# Patient Record
Sex: Female | Born: 1935 | Race: White | Marital: Married | Smoking: Never smoker
Health system: Northeastern US, Academic
[De-identification: ages and names within clinical notes are randomized; demographics above are authoritative.]

## PROBLEM LIST (undated history)

## (undated) DIAGNOSIS — I10 Essential (primary) hypertension: Secondary | ICD-10-CM

## (undated) HISTORY — PX: ABDOMINAL HYSTERECTOMY: SHX81

---

## 2004-01-04 ENCOUNTER — Other Ambulatory Visit: Payer: Self-pay | Admitting: Cardiology

## 2007-04-27 ENCOUNTER — Other Ambulatory Visit: Payer: Self-pay

## 2007-08-05 DIAGNOSIS — K649 Unspecified hemorrhoids: Secondary | ICD-10-CM | POA: Insufficient documentation

## 2007-08-05 DIAGNOSIS — F411 Generalized anxiety disorder: Secondary | ICD-10-CM | POA: Insufficient documentation

## 2007-08-05 DIAGNOSIS — N318 Other neuromuscular dysfunction of bladder: Secondary | ICD-10-CM | POA: Insufficient documentation

## 2007-08-05 DIAGNOSIS — K219 Gastro-esophageal reflux disease without esophagitis: Secondary | ICD-10-CM | POA: Insufficient documentation

## 2007-11-11 DIAGNOSIS — R002 Palpitations: Secondary | ICD-10-CM | POA: Insufficient documentation

## 2007-11-11 DIAGNOSIS — I10 Essential (primary) hypertension: Secondary | ICD-10-CM | POA: Insufficient documentation

## 2008-02-17 DIAGNOSIS — L719 Rosacea, unspecified: Secondary | ICD-10-CM | POA: Insufficient documentation

## 2008-02-17 DIAGNOSIS — M810 Age-related osteoporosis without current pathological fracture: Secondary | ICD-10-CM | POA: Insufficient documentation

## 2008-07-11 DIAGNOSIS — L570 Actinic keratosis: Secondary | ICD-10-CM | POA: Insufficient documentation

## 2008-07-11 DIAGNOSIS — F3289 Other specified depressive episodes: Secondary | ICD-10-CM | POA: Insufficient documentation

## 2008-08-15 DIAGNOSIS — I359 Nonrheumatic aortic valve disorder, unspecified: Secondary | ICD-10-CM | POA: Insufficient documentation

## 2008-08-15 DIAGNOSIS — E781 Pure hyperglyceridemia: Secondary | ICD-10-CM | POA: Insufficient documentation

## 2008-08-22 ENCOUNTER — Other Ambulatory Visit: Payer: Self-pay

## 2009-04-18 ENCOUNTER — Ambulatory Visit
Admit: 2009-04-18 | Discharge: 2009-04-18 | Disposition: A | Discharge status: Home / Self Care | Payer: Self-pay | Source: Ambulatory Visit | Admitting: Obstetrics

## 2009-04-26 ENCOUNTER — Ambulatory Visit: Payer: Self-pay | Admitting: Internal Medicine

## 2009-04-27 ENCOUNTER — Ambulatory Visit
Admit: 2009-04-27 | Discharge: 2009-04-27 | Disposition: A | Discharge status: Home / Self Care | Payer: Self-pay | Source: Ambulatory Visit | Attending: Internal Medicine | Admitting: Internal Medicine

## 2009-05-01 ENCOUNTER — Ambulatory Visit
Admit: 2009-05-01 | Discharge: 2009-05-01 | Disposition: A | Discharge status: Home / Self Care | Payer: Self-pay | Source: Ambulatory Visit | Attending: Internal Medicine | Admitting: Internal Medicine

## 2009-05-04 ENCOUNTER — Ambulatory Visit: Payer: Self-pay | Admitting: Internal Medicine

## 2009-05-04 ENCOUNTER — Ambulatory Visit
Admit: 2009-05-04 | Discharge: 2009-05-04 | Disposition: A | Discharge status: Home / Self Care | Payer: Self-pay | Source: Ambulatory Visit | Attending: Internal Medicine | Admitting: Internal Medicine

## 2009-05-04 LAB — CBC AND DIFFERENTIAL
Baso # K/uL: 0 THOU/uL (ref 0.0–0.1)
Basophil %: 0.4 % (ref 0.1–1.2)
Eos # K/uL: 0.2 THOU/uL (ref 0.0–0.4)
Eosinophil %: 2.6 % (ref 0.7–5.8)
Hematocrit: 37 % (ref 34–45)
Hemoglobin: 12.2 g/dL (ref 11.2–15.7)
Lymph # K/uL: 2.8 THOU/uL (ref 1.2–3.7)
Lymphocyte %: 35.3 % (ref 19.3–51.7)
MCV: 95 fL (ref 79–95)
Mono # K/uL: 0.8 THOU/uL (ref 0.2–0.9)
Monocyte %: 10.5 % (ref 4.7–12.5)
Neut # K/uL: 4.1 THOU/uL (ref 1.6–6.1)
Platelets: 288 THOU/uL (ref 160–370)
RBC: 3.9 MIL/uL (ref 3.9–5.2)
RDW: 12.7 % (ref 11.7–14.4)
Seg Neut %: 51.2 % (ref 34.0–71.1)
WBC: 8 THOU/uL (ref 4.0–10.0)

## 2009-05-04 LAB — COMPREHENSIVE METABOLIC PANEL
ALT: 15 U/L (ref 0–35)
AST: 25 U/L (ref 0–35)
Albumin: 4.1 g/dL (ref 3.5–5.2)
Alk Phos: 48 U/L (ref 35–105)
Anion Gap: 11 (ref 7–16)
Bilirubin,Total: 0.2 mg/dL (ref 0.0–1.2)
CO2: 25 mmol/L (ref 20–28)
Calcium: 8.9 mg/dL (ref 8.6–10.2)
Chloride: 97 mmol/L (ref 96–108)
Creatinine: 0.72 mg/dL (ref 0.51–0.95)
GFR,Black: >59 *
GFR,Caucasian: >59 *
Glucose: 95 mg/dL (ref 74–106)
Lab: 15 mg/dL (ref 6–20)
Potassium: 4.6 mmol/L (ref 3.3–5.1)
Sodium: 133 mmol/L (ref 133–145)
Total Protein: 7 g/dL (ref 6.3–7.7)

## 2009-05-09 LAB — HISTOPLASMA CF/ID AB PANEL
Histoplasma Ab: NOT DETECTED
Histoplasma Mycelial AB: <1:8 {titer}
Histoplasma Yeast AB: <1:8 {titer}

## 2009-05-09 LAB — COCCIDIOIDES ANTIBODIES
Coccidioides (ID) AB: NOT DETECTED
Coccidioides Ab: <1:2 {titer}
Coccidioides IgG: 0.6 IV
Coccidioides IgM: 0.1 IV

## 2009-05-24 ENCOUNTER — Ambulatory Visit: Payer: Self-pay | Admitting: Internal Medicine

## 2009-06-27 ENCOUNTER — Ambulatory Visit: Payer: Self-pay | Admitting: Internal Medicine

## 2009-06-29 ENCOUNTER — Encounter: Payer: Self-pay | Admitting: Gastroenterology

## 2009-06-29 ENCOUNTER — Ambulatory Visit: Payer: Self-pay | Admitting: Internal Medicine

## 2009-06-30 NOTE — Progress Notes (Signed)
 Reason For Visit   Urinary frequency and incontinence. Persitent cough again. Pt unable to   sleep with current problems. Pt has been taking husband's Macrobid past 2   days. Pt was encouraged not to take unless directed by MD.  HPI   Dysuria      Urinary accidents      Cough dry and persisitent   Took some honey  and helps  a bit      .  Allergies   Bee sting.  Current Meds   ** Medication reconciliation completed. **.  Premarin 0.625 MG Tablet;TAKE 1 TABLET DAILY.; RPT  Multivitamins Tablet;TAKE 1 TABLET DAILY.; RPT  Calcium 600 + D 600-200 MG-UNIT TABS;TAKE 1 TABLET DAILY.; RPT  Nitroglycerin 0.4 MG SUBL;DISSOLVE 1 TABLET UNDER THE TONGUE AS NEEDED FOR   CHEST PAIN.; Rx  Aspirin 81 MG Tablet;TAKE 2 TABLET DAILY; Rx  Fluticasone Propionate 50 MCG/ACT Suspension;USE 2 SPRAYS IN EACH NOSTRIL   ONCE DAILY; Rx  Propranolol HCl 20 MG Tablet;TAKE 1 TABLET TWICE DAILY.; Rx  Anucort-HC 25 MG Suppository;INSERT AS DIRECTED; Rx  Amitriptyline HCl 10 MG Tablet;TAKE ONE TABLET AT BEDTIME; Rx.  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  Vital Signs   Recorded by dpope on 29 Jun 2009 09:23 AM  BP:124/58,  LUE,  Sitting,   HR: 94 b/min,   Temp: 99.6 F,   Weight: 157 lb,   O2 Sat: 96 (%SpO2),  RA.  Physical Exam   GENERAL: 78 year y/o female in NAD.  HEENT: Neck supple, PERRL, EOMI, Fundi benign, TM's clear bilaterally,   oropharynx clear, thyroid without obvious nodules or enlargement. No   lymphadenopathy.   LUNGS: CTA bilaterally, no wheezes with forced expiration, respirations   unlabored.  HEART: Regular rate without murmurs, rubs, or gallops. No carotid bruits   heard.  BREASTS:not  done  ABDOMEN: + BS. Soft, mild tenderness in the hypogastrium. no HSM, no   palpable masses, scars, or lesions.  PELVIC:not done  RECTAL: Normal tone, no palpable masses, stool Hemoccult negative.  EXTREMITIES: Peripheral pulses 2+ bilaterally, no edema.  SKIN: No rashes, no evidence of skin breakdown, no suspicious nevi.  NEURO: Cranial nerves II-XII intact. Gait normal. Reflexes 2+ and symmetric.  MENTAL STATUS: Alert, normal MS. Answers all questions appropriately.  Assessment   Possible UTI: Patient will be started on ciprofloxacin pending culture   reports.  Patient was advised to drink a lot of fluids including cranberry   juice.  A lot of information on UTI provided with handouts.     Cough : patient has persistent cough.  Patient had some nodules in the   right middle lobe region on the CAT scan of the chest.  The mentioning   about the possibility of fungal infection or Mycobacterium avium   intracellulare Patient will be referred to Dr. Tama High a pulmonologist since   patient is not improving as we expected.     anxiety: Patient shall continue with supportive care.  Patient has a very   supportive husband.     nasal allergies: Patient will continue on the Flonase.      chronic insomnia: Patient shall continue on the small doses off  amitriptyline.  Plan   Follow-up: One months or earlier if needed.  Signature   Electronically signed by: Gaye Pollack  M.D.; 06/30/2009 11:11 PM   EST.

## 2009-07-09 ENCOUNTER — Ambulatory Visit: Payer: Self-pay | Admitting: Internal Medicine

## 2009-07-09 ENCOUNTER — Ambulatory Visit
Admit: 2009-07-09 | Discharge: 2009-07-09 | Disposition: A | Discharge status: Home / Self Care | Payer: Self-pay | Source: Ambulatory Visit | Admitting: Internal Medicine

## 2009-07-11 ENCOUNTER — Ambulatory Visit: Payer: Self-pay | Admitting: Primary Care

## 2009-07-11 LAB — AEROBIC CULTURE

## 2009-07-12 ENCOUNTER — Ambulatory Visit: Payer: Self-pay | Admitting: Internal Medicine

## 2009-07-12 NOTE — Progress Notes (Signed)
Reason For Visit   Cough/fevers  pleonelpn.  Allergies   Bee sting  Cipro TABS; Headache.  Current Meds   ** Medication reconciliation completed. **.  Premarin 0.625 MG Tablet;TAKE 1 TABLET DAILY.; RPT  Multivitamins Tablet;TAKE 1 TABLET DAILY.; RPT  Calcium 600 + D 600-200 MG-UNIT TABS;TAKE 1 TABLET DAILY.; RPT  Nitroglycerin 0.4 MG SUBL;DISSOLVE 1 TABLET UNDER THE TONGUE AS NEEDED FOR   CHEST PAIN.; Rx  Aspirin 81 MG Tablet;TAKE 2 TABLET DAILY; Rx  Fluticasone Propionate 50 MCG/ACT Suspension;USE 2 SPRAYS IN EACH NOSTRIL   ONCE DAILY; Rx  Anucort-HC 25 MG Suppository;INSERT AS DIRECTED; Rx  Amitriptyline HCl 10 MG Tablet;TAKE ONE TABLET AT BEDTIME; Rx  Propranolol HCl 20 MG Tablet;TAKE 1 TABLET TWICE DAILY.; Rx.  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  Vital Signs   Recorded by pleone on 11 Jul 2009 09:35 AM  Temp: 97.8 F,   Weight: 154 lb.  SOAP   Cough  SUBJECTIVE:  76 year old female, accompanied by husband, presenting for   evaluation of persistent cough with fever.  For months patient has chronic   frequent nonproductive cough.  Patient notes cough is disruptive during the   day as well as at night when she tries to sleep.  She has also had   persistent fever with some sweats.  No chest tightness, shortness of   breath, nausea, vomiting or abdominal pain.  Mild nasal congestion, no   significant postnasal drip.  She was recently on Cipro which was   discontinued before she completed the full course because of headache.  She   has an appointment with Dr. Rosalio Macadamia in two weeks and did not feel she could   wait until that appointment for further treatment.  Nonsmoker.  No history    of asthma or chronic lung disease.  OBJECTIVE:    Afebrile.  No acute distress.  Skin without rash.  Eyes   without injection, tearing or drainage.  TM's gray and translucent, without   retraction or fluid.  Nasal membranes congested.  Moist oral mucosa.    Pharynx without erythema, exudates or ulcerations.  Neck without   adenopathy.  Lungs excellent air entry, no crackles, wheezes or rhonchi.    Heart regular rhythm.  CT of chest May 01, 2009 --multiple small   nodules with bronchiectasis right middle lobe and lingula.  ASSESSMENT:    Chronic cough/fever with localized   nodule/bronchiectasis-suspicious for mycoplasma infection  PLAN:    sputum cultures ordered.  Patient eager to begin some form of   treatment.  Recommended Biaxin 500 mg t.i.d. for 14 days.  She may continue   expectorant and cough suppressants.  She will keep appointment as scheduled   with pulmonology.  Follow-up as indicated.  Signature   Electronically signed by: Fabio Bering  M.D.; 07/12/2009 7:46 PM EST.

## 2009-07-15 NOTE — Progress Notes (Signed)
 Reason For Visit   Cough persist, chills and fever 103 tuesday night.  HPI   Patient continues to have a persistent dry cough and has had a high fever   with chills.  Patient was seen by Dr. Rogers Blocker yesterday and had been put on   Biaxin.     Patient has a history of chronic cough for several months now and had chest   x-ray suggestive of right middle lobe nodular opacification leading onto a   CAT scan of the chest confirming the same.     Patient lives in the older large home and has been exposed to a lot of dust   and possible fungal infection.  Patient had some investigations done.    Patient's husband accompanied the patient today and was wondering if we   will be able to do a PPD.  Patient does not have any history of   tuberculosis in herself or  had any contact lately.     patient has an appointment with the specialist in the next 10 days.  Allergies   Bee sting  Cipro TABS; Headache.  Current Meds   ** Medication reconciliation completed. **.  Premarin 0.625 MG Tablet;TAKE 1 TABLET DAILY.; RPT  Multivitamins Tablet;TAKE 1 TABLET DAILY.; RPT  Calcium 600 + D 600-200 MG-UNIT TABS;TAKE 1 TABLET DAILY.; RPT  Nitroglycerin 0.4 MG SUBL;DISSOLVE 1 TABLET UNDER THE TONGUE AS NEEDED FOR   CHEST PAIN.; Rx  Aspirin 81 MG Tablet;TAKE 2 TABLET DAILY; Rx  Fluticasone Propionate 50 MCG/ACT Suspension;USE 2 SPRAYS IN EACH NOSTRIL   ONCE DAILY; Rx  Anucort-HC 25 MG Suppository;INSERT AS DIRECTED; Rx  Amitriptyline HCl 10 MG Tablet;TAKE ONE TABLET AT BEDTIME; Rx  Propranolol HCl 20 MG Tablet;TAKE 1 TABLET TWICE DAILY.; Rx  Clarithromycin 500 MG Tablet;TAKE 1 TABLET TWICE DAILY FOR 14 DAYS.; Rx.  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  Vital Signs   Recorded by Roe Rutherford on 11 Jul 2009 09:35 AM  Temp: 97.8 F,   Weight: 154 lb.  Recorded by dpope on 12 Jul 2009 10:12 AM  BP:136/52,  LUE,  Sitting,   HR: 94 b/min,   Temp: 99.1 F,   Weight: 154 lb,   O2 Sat: 97 (%SpO2),  RA.  Physical Exam   GENERAL APPEARANCE: Appears stated age   patient is uncomfortable with a dry cough.  Mild afebrile.  HEENT: PERRL, EOMI, TMs normal, oropharynx clear  LUNGS: patient had harsh vesicular breath sounds without any significant   crackles or any prodrome or wheezing.  HEART: Normal S1,S2 without murmurs, gallops, or rubs  ABDOMEN: NABS, soft, non-tender, without hepato-splenomegaly  EXTREMITIES: Without clubbing, cyanosis, or edema  NEUROLOGIC: Alert and oriented x3,  Assessment   Persistent cough: Patient has some nodular opacification in the right   middle lobe region in the chest x-ray and a CAT scan of the chest.  Patient   has had some blood tests for coccidioidal mycosis returned negative.  We   may be able to send her for further blood work for mycoplasma and   legionella titers.  Patient will come back on Monday for a PPD placement.    Patient may be put on  prednisone probably around 30-mg a day till she sees   the specialist.  Patient however should not be on the prednisone before the   PPD results become available.  Patient will be started on Robitussin a.c.   5-mL q.4 hours p.r.n. for now.  Plan   Follow up: As previously planned.  Signature   Electronically signed by: Gaye Pollack  M.D.; 07/15/2009 8:59 PM   EST.

## 2009-07-15 NOTE — Progress Notes (Signed)
 Reason For Visit   Fever,urinary difficulty and abd cramping. Pt had to stop Cipro caused head   pain.  --PAIN: Patient  acknowledges pain in the last week.       --If yes, 0-10 pain rating: 3.  HPI   Cough still      Had  high fever upto 101 degrrees F   Advil helps with the cough   has some chills.     Patient does not have any dysuria or hematuria.     Patient does not have any abdominal pain.  No major weakness or numbness.     Patient feels very tired.     Is trying to give a urine specimen      stomach is crampy -- may be taking a lot of cough drops      Has problems with urinary continence      patient lives with  her husband in an older home.  Patient went to   [place] for the holidays to be with one of her daughters.     Medications and allergies are as in the chart.  Allergies   Bee sting  Cipro TABS; Headache.  Current Meds   ** Medication reconciliation completed. **.  Premarin 0.625 MG Tablet;TAKE 1 TABLET DAILY.; RPT  Multivitamins Tablet;TAKE 1 TABLET DAILY.; RPT  Calcium 600 + D 600-200 MG-UNIT TABS;TAKE 1 TABLET DAILY.; RPT  Nitroglycerin 0.4 MG SUBL;DISSOLVE 1 TABLET UNDER THE TONGUE AS NEEDED FOR   CHEST PAIN.; Rx  Aspirin 81 MG Tablet;TAKE 2 TABLET DAILY; Rx  Fluticasone Propionate 50 MCG/ACT Suspension;USE 2 SPRAYS IN EACH NOSTRIL   ONCE DAILY; Rx  Anucort-HC 25 MG Suppository;INSERT AS DIRECTED; Rx  Amitriptyline HCl 10 MG Tablet;TAKE ONE TABLET AT BEDTIME; Rx  Propranolol HCl 20 MG Tablet;TAKE 1 TABLET TWICE DAILY.; Rx.  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  Vital Signs   Recorded by dpope on 09 Jul 2009 09:12 AM  BP:136/62,  RUE,  Sitting,   HR: 78  b/min,  R Radial, Regular,   Temp: 98.7 F,   Weight: 154 lb.  Physical Exam   GENERAL APPEARANCE: Appears stated age, well appearing   HEENT: PERRL, EOMI, TMs normal, oropharynx clear  LUNGS: patient has some harsh vesicular breath sounds.  No significant   wheezing or crackles heard today.  HEART: Normal S1,S2 without murmurs, gallops, or rubs  ABDOMEN: NABS, soft, non-tender, without hepato-splenomegaly  EXTREMITIES: Without clubbing, cyanosis, or edema  NEUROLOGIC: Alert and oriented x3, cranial nerves II-XII intact,   motor/sensory exam normal, DTRs symmetric, normal gait.  Assessment   Cough : Patient has a history of cough right middle lobe nodular opacities   and is planning to see a pulmonologist in the next few days.  Patient does   not have any fever or chills.  No need for repeating the chest x-ray though   it has to be done.  Patient will require more investigations.     FAO:ZHYQMVH has fever and chills.  Patient will be started on ciprofloxacin   for possible UTI as well for the cough.  Patient's urine will be sent for   culture before she started on the antibiotic.  anxiety: As usual patient said the longest of complaints return and all her   questions were invited and answered to her satisfaction.      chronic insomnia: Patient to continued with small doses of amitriptyline.  Plan   Follow-up: Previously planned.  Signature   Electronically signed by: Gaye Pollack  M.D.; 07/15/2009 8:53 PM   EST.

## 2009-07-16 ENCOUNTER — Ambulatory Visit: Payer: Self-pay

## 2009-07-16 ENCOUNTER — Ambulatory Visit
Admit: 2009-07-16 | Discharge: 2009-07-16 | Disposition: A | Discharge status: Home / Self Care | Payer: Self-pay | Source: Ambulatory Visit | Attending: Internal Medicine | Admitting: Internal Medicine

## 2009-07-18 ENCOUNTER — Ambulatory Visit
Admit: 2009-07-18 | Discharge: 2009-07-18 | Disposition: A | Discharge status: Home / Self Care | Payer: Self-pay | Source: Ambulatory Visit | Attending: Internal Medicine | Admitting: Internal Medicine

## 2009-07-18 LAB — DATE/TIME NOT PROVIDED

## 2009-07-18 LAB — GRAM STAIN: Gram Stain: >10

## 2009-07-19 LAB — MYCOPLASMA PNEUMONIAE IGG/IGM AB
M.Pneumoniae IgG: 0.02 U/L
M.Pneumoniae IgM: 0.02 U/L (ref <=0.76)

## 2009-07-24 ENCOUNTER — Ambulatory Visit
Admit: 2009-07-24 | Discharge: 2009-07-24 | Disposition: A | Discharge status: Home / Self Care | Payer: Self-pay | Source: Ambulatory Visit | Attending: Pulmonology | Admitting: Pulmonology

## 2009-07-26 LAB — LEGIONELLA CULTURE

## 2009-07-31 ENCOUNTER — Ambulatory Visit
Admit: 2009-07-31 | Disposition: A | Discharge status: Home / Self Care | Payer: Self-pay | Source: Ambulatory Visit | Attending: Pulmonology | Admitting: Pulmonology

## 2009-07-31 LAB — GRAM STAIN

## 2009-08-01 LAB — FUNGAL STAIN

## 2009-08-01 LAB — AFB STAIN: AFB Stain: 0

## 2009-08-01 LAB — MEDICAL CYTOLOGY

## 2009-08-02 LAB — AEROBIC CULTURE: Aerobic Culture: 0

## 2009-08-03 LAB — LEGIONELLA CULTURE: Legionella Culture: 0

## 2009-08-06 ENCOUNTER — Ambulatory Visit
Admit: 2009-08-06 | Discharge: 2009-08-06 | Disposition: A | Discharge status: Home / Self Care | Payer: Self-pay | Source: Ambulatory Visit | Attending: Internal Medicine | Admitting: Internal Medicine

## 2009-08-06 ENCOUNTER — Ambulatory Visit: Payer: Self-pay | Admitting: Internal Medicine

## 2009-08-06 LAB — GRAM STAIN: Gram Stain: <=10

## 2009-08-08 LAB — AEROBIC CULTURE: Aerobic Culture: NORMAL

## 2009-08-12 NOTE — Progress Notes (Signed)
 Reason For Visit   Productive persistent cough dark thick yellow sputum noted and fever.  HPI   Patient continues to have a cough but now producing thick green sputum.    Patient brought in a sample of this to the office.  Patient does not   hemoptysis.     Patient has had the cough for almost 3 months now.  Patient has gone for   several chest x-rays and a CAT scan of the chest showed possible shadowing   in the right middle lobe.     Because of the persistence and lack of improvement with different   antibiotics the patient was sent for investigations by a pulmonologist.    Patient went for a fiber-optic bronchoscopy which fortunately did not show   any sinister mass lesions.  Patient had a lot of mucus secretion in the   right middle lobe and was advised to use mucinec over-the-counter.  Patient   had been doing it without much help as yet.  Allergies   Bee sting  Cipro TABS; Headache.  Current Meds   ** Medication reconciliation completed. **.  Premarin 0.625 MG Tablet;TAKE 1 TABLET DAILY.; RPT  Multivitamins Tablet;TAKE 1 TABLET DAILY.; RPT  Calcium 600 + D 600-200 MG-UNIT TABS;TAKE 1 TABLET DAILY.; RPT  Nitroglycerin 0.4 MG SUBL;DISSOLVE 1 TABLET UNDER THE TONGUE AS NEEDED FOR   CHEST PAIN.; Rx  Aspirin 81 MG Tablet;TAKE 2 TABLET DAILY; Rx  Anucort-HC 25 MG Suppository;INSERT AS DIRECTED; Rx  Propranolol HCl 20 MG Tablet;TAKE 1 TABLET TWICE DAILY.; Rx  Cheratussin AC 100-10 MG/5ML Syrup;TAKE 1 TEASPOONFUL EVERY 4 HOURS AS   NEEDED.; Rx  [place] SYRP;TAKE 4 ML 3 TIMES DAILY PRN; RPT  Amitriptyline HCl 10 MG Tablet;TAKE 1 TABLET TWICE DAILY AS NEEDED.; Rx.  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  Vital Signs   Recorded by dpope on 06 Aug 2009 09:13 AM  BP:152/72,  LUE,  Sitting,   HR: 92 b/min,   Temp: 101.2 F,   Weight: 150 lb,   O2 Sat: 98 (%SpO2),  RA.  Physical Exam   GENERAL APPEARANCE: Appears stated age   patient has a cough during the examination and has green sputum.  Patient has a fever.  HEENT: PERRL, EOMI, TMs normal, oropharynx clear  LUNGS: patient has some crackles in the right middle lobe region.  HEART: Normal S1,S2 without murmurs, gallops, or rubs  ABDOMEN: NABS, soft, non-tender, without hepato-splenomegaly  EXTREMITIES: Without clubbing, cyanosis, or edema  NEUROLOGIC: Alert and oriented x3,  Assessment   Right middle lobe possible pneumonia: Patient was advised to consider going   back on Avelox given for fever.  Patient has been already on this   medication before and therefore wanted to check with the pulmonologist   before starting on any antibiotic.  Patient will continue with supportive   care and will have a prescription for Robitussin a.c.  Patient will   continue with supportive care measures which were discussed again at length.  Plan   Follow-up: As previously planned or earlier if needed.  Signature   Electronically signed by: Gaye Pollack  M.D.; 08/12/2009 8:48 PM   EST.

## 2009-08-14 ENCOUNTER — Ambulatory Visit: Payer: Self-pay | Admitting: Internal Medicine

## 2009-08-14 LAB — LEGIONELLA CULTURE

## 2009-08-14 NOTE — Progress Notes (Signed)
 Reason For Visit   Painful productive cough and SOB  --PAIN: Patient  acknowledges pain in the last week.       --If yes, 0-10 pain rating: 9        --If pain rating > 3; location: right side chest        --Duration of pain: 3 days        --Aggravating factors: coughing        --Relieving factors: nothing.  HPI   Has continuous cough -- mostly non productive   No hemoptysis  Has some pain in the rt antr chest -- upomnn coughing   No pleuritic pain      Appetite is down      Wt is stable      The cough had been going on for more than 3 m and is not showing any   improvement      She had so many investigations      CT chest showed possible nodular lesions in the rt middle lobe   She had been trated with a course of Avelox, Biaxin and is now   Azithronmycin      She had a klot of blood tests and these have been essentially nl      Has had severak sputum studies and has grown only nl oral flora      Has seen Dr Tama High and had a Pul function tests done and then a FOB      Had secretions in the RML   Mucinex was prexscribed and now stopped      Has   had negative ofr cancer cells in the bronchial brush and wash      Has had tests for Legionella,  Histoplasma, Nocardia -- all  negative     There had been buddib=ng yeasts in teh bronchoscopy with 1+ growth of   candiad albicans ? Significance      She has been strted on Astropro -- Astelin nasal spray and Azithromycin 250   mg 3 x a week      Pt is still feeling awful with a cont=stant cough and is quite fristratedb     Has a very caring -- Too caring husband  who is equally concerned      there  has been   apoosible culture for Mycobacteria pending -- the direct   smears were bnegative, Cultures so far pending    PPD negative      no fever      Unable to sleep      no abdo pain 'No diarrhea      Bary Leriche feels tired and run down   Meds and allergies reviewed.  Allergies   Bee sting  Cipro TABS; Headache.  Current Meds   ** Medication reconciliation completed.  **.  Premarin 0.625 MG Tablet;TAKE 1 TABLET DAILY.; RPT  Multivitamins Tablet;TAKE 1 TABLET DAILY.; RPT  Calcium 600 + D 600-200 MG-UNIT TABS;TAKE 1 TABLET DAILY.; RPT  Nitroglycerin 0.4 MG SUBL;DISSOLVE 1 TABLET UNDER THE TONGUE AS NEEDED FOR   CHEST PAIN.; Rx  Aspirin 81 MG Tablet;TAKE 2 TABLET DAILY; Rx  Anucort-HC 25 MG Suppository;INSERT AS DIRECTED; Rx  Propranolol HCl 20 MG Tablet;TAKE 1 TABLET TWICE DAILY.; Rx  Amitriptyline HCl 10 MG Tablet;TAKE 1 TABLET TWICE DAILY AS NEEDED.; Rx  Astepro 0.15 % Solution;1 spray each nostril bid; RPT  Azithromycin 250 MG Tablet;TAKE 1 TABLET DAILY mon-wed-friday; RPT.  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  Vital Signs   Recorded by dpope on 14 Aug 2009 11:23 AM  BP:146/72,   HR: 105 b/min,  L Radial, Regular,   Temp: 98.4 F,   O2 Sat: 93 (%SpO2),  RA.  Physical Exam   GENERAL APPEARANCE: Appears stated age   anxious  Coughing -- dry   HEENT: PERRL, EOMI, TMs normal, oropharynx clear  LUNGS: Clear to auscultation and percussion, no pleural rub   HEART: Normal S1,S2 without murmurs, gallops, or rubs  ABDOMEN: NABS, soft, non-tender, without hepato-splenomegaly  EXTREMITIES: Without clubbing, cyanosis, or edema  NEUROLOGIC: Alert and oriented x3, no focal deficit.  Assessment   Cough: Not responding to [facility] treatment. Pt was advised to take the   Robutussin ACV 5ml Q 4hrs, May consider Tesslon pearls but pt is afraid of   swallowing pills      Ct with the Azithromycin      Ct with the antihistamnine Nasal spray      May be a candaidate for steroid inhalers or a trial of oral prednisone 30mg    QD for 2 weeks to see if the brib=nchial  hyoeractivity can be quiteneed.   But given the suspicion of a very remote unlikely possibilty of TB steroids   need to be used very cautiously      Pt has a FUP appt with dr Tama High in 1 m      I will request Dr Tama High To see pt now to have  a reassuring talk      Chr Insomnia: will try Zolpidem, This might give her  a break from her   cough and rest well.  Plan   Atleast was spent with the pt going over all the work up so far.   Will request Dr Tama High to follow up sooner and can always see him after all   the TB culture results become available.  Signature   Electronically signed by: Gaye Pollack  M.D.; 08/14/2009 12:53 PM   EST.

## 2009-08-20 ENCOUNTER — Inpatient Hospital Stay
Admit: 2009-08-20 | Disposition: A | Discharge status: Home Health | Payer: Self-pay | Source: Ambulatory Visit | Attending: Internal Medicine | Admitting: Internal Medicine

## 2009-08-20 LAB — LACTATE DEHYDROGENASE, BODY FLUID: LD,FL: 8766 U/L

## 2009-08-20 LAB — CK ISOENZYMES
CK: 23 U/L — ABNORMAL LOW (ref 34–145)
Mass CKMB: 2.2 ng/mL (ref 0.0–2.9)

## 2009-08-20 LAB — GLUCOSE, BODY FLUID: Glucose,FL: <2 mg/dL

## 2009-08-20 LAB — URINALYSIS WITH REFLEX TO CULTURE
Specific Gravity,UA: 1.023 (ref 1.001–1.030)
pH,UA: 5 (ref 5.0–8.0)

## 2009-08-20 LAB — URINALYSIS REFLEX TO CULTURE
Blood,UA: NEGATIVE
Leuk Esterase,UA: NEGATIVE
Nitrite,UA: NEGATIVE
Protein,UA: 25 mg/dL — AB

## 2009-08-20 LAB — GRAM STAIN

## 2009-08-20 LAB — PROTIME-INR
INR: 1.1 (ref 0.9–1.1)
Protime: 14.5 s (ref 11.9–14.7)

## 2009-08-20 LAB — TROPONIN T: Troponin T: <0.01 ng/mL (ref 0.00–0.02)

## 2009-08-20 LAB — COMPREHENSIVE METABOLIC PANEL
ALT: 20 U/L (ref 0–35)
AST: 31 U/L (ref 0–35)
Albumin: 2.5 g/dL — ABNORMAL LOW (ref 3.5–5.2)
Alk Phos: 134 U/L — ABNORMAL HIGH (ref 35–105)
Anion Gap: 12 (ref 7–16)
Bilirubin,Total: 0.4 mg/dL (ref 0.0–1.2)
CO2: 26 mmol/L (ref 20–28)
Calcium: 8.5 mg/dL — ABNORMAL LOW (ref 8.6–10.2)
Chloride: 92 mmol/L — ABNORMAL LOW (ref 96–108)
Creatinine: 0.64 mg/dL (ref 0.51–0.95)
GFR,Black: >59 *
GFR,Caucasian: >59 *
Globulin: 3.7 g/dL (ref 2.7–4.3)
Glucose: 150 mg/dL — ABNORMAL HIGH (ref 74–106)
Lab: 15 mg/dL (ref 6–20)
Potassium: 5 mmol/L (ref 3.3–5.1)
Sodium: 130 mmol/L — ABNORMAL LOW (ref 133–145)
Total Protein: 6.2 g/dL — ABNORMAL LOW (ref 6.3–7.7)

## 2009-08-20 LAB — CBC AND DIFFERENTIAL
Baso # K/uL: 0 THOU/uL (ref 0.0–0.1)
Basophil %: 0 % (ref 0.1–1.2)
Eos # K/uL: 0 THOU/uL (ref 0.0–0.4)
Eosinophil %: 0 % — ABNORMAL LOW (ref 0.7–5.8)
Hematocrit: 33 % — ABNORMAL LOW (ref 34–45)
Hemoglobin: 11.2 g/dL (ref 11.2–15.7)
Lymph # K/uL: 2 THOU/uL (ref 1.2–3.7)
Lymphocyte %: 7 % — ABNORMAL LOW (ref 19.3–51.7)
MCV: 90 fL (ref 79–95)
Mono # K/uL: 1.4 THOU/uL — ABNORMAL HIGH (ref 0.2–0.9)
Monocyte %: 5 % (ref 4.7–12.5)
Neut # K/uL: 24.5 THOU/uL — ABNORMAL HIGH (ref 1.6–6.1)
Platelets: 615 THOU/uL — ABNORMAL HIGH (ref 160–370)
RBC: 3.7 MIL/uL — ABNORMAL LOW (ref 3.9–5.2)
RDW: 13.1 % (ref 11.7–14.4)
Seg Neut %: 86 % — ABNORMAL HIGH (ref 34.0–71.1)
WBC: 27.8 THOU/uL — ABNORMAL HIGH (ref 4.0–10.0)

## 2009-08-20 LAB — BLOOD GAS, ARTERIAL
Base Excess, Arterial: 5
CO2,ART (Calc): 29 mmol/L — ABNORMAL HIGH (ref 21–28)
CO: 1.3 % (ref 0.0–1.4)
FO2 Hb, Arterial: 94 % (ref 90–95)
HCO3, Arterial: 28 mmol/L (ref 22–30)
Hemoglobin: 12.3 g/dL (ref 11.2–15.7)
Methemoglobin: 0.1 % (ref 0.0–1.0)
pCO2, Arterial: 37 mmHg (ref 35–45)
pH: 7.49 — ABNORMAL HIGH (ref 7.36–7.44)
pO2,Arterial: 69 mmHg — ABNORMAL LOW (ref 80–100)

## 2009-08-20 LAB — URINE MICROSCOPIC (IQ200): RBC,UA: NONE SEEN /HPF (ref 0–2)

## 2009-08-20 LAB — BODY FLUID CELL COUNT
Nucl Cell,FL: 128380 /uL
RBC,FL: 110000 /uL
Slide #,FL: 2147

## 2009-08-20 LAB — LEGIONELLA ANTIGEN, URINE

## 2009-08-20 LAB — BLOOD BANK HOLD PINK

## 2009-08-20 LAB — PH, BODY FLUID: pH,FL: 6.5

## 2009-08-20 LAB — MANUAL DIFFERENTIAL

## 2009-08-20 LAB — SLIDE NUMBER: Slide # (Heme): 2122

## 2009-08-20 LAB — APTT: aPTT: 30.4 s (ref 22.3–35.3)

## 2009-08-20 LAB — HOLD MISC. FLUIDS

## 2009-08-20 LAB — PROTEIN, BODY FLUID: Protein,FL: 4.6 g/dL

## 2009-08-20 LAB — BANDS: Bands %: 2 % (ref 0–10)

## 2009-08-20 LAB — NT-PRO BNP: NT-pro BNP: 7336 pg/mL — ABNORMAL HIGH (ref 0–1800)

## 2009-08-20 LAB — S. PNEUMONIAE ANTIGEN

## 2009-08-20 LAB — LACTATE, PLASMA: Lactate: 1.5 mmol/L (ref 0.7–2.1)

## 2009-08-20 LAB — DIFF BASED ON: Diff Based On: 100 CELLS

## 2009-08-20 NOTE — Consults (Addendum)
INDICATION FOR CONSULT: Right empyema.    HISTORY OF PRESENT ILLNESS:  The patient is an 76 year old woman with  anxiety, who has complained of a cough for about 7 months, which worsened 4  months ago.  She underwent chest x-rays, CT scans, and a bronchoscopy in  January; all were apparently unrevealing.  The cough continued, and the  patient became progressively weak and anorexic.  She fell 2 days ago and  developed abdominal pain.  She was admitted.  She came to the emergency  department at [facility] complaining of shortness of breath, no fevers, and  dyspnea at rest and worse with exertion.  She had been on azithromycin for  about a week.  She apparently had also been evaluated for other infections,  including Legionella, Histoplasmosis, Nocardiosis, Candida, and Mycoplasma,  and these were all negative.  She had a repeat chest x-ray on admission  last night, as well as a CT scan; and she had a large right partially  loculated pleural effusion with air-fluid levels in it.  The left lung was  clear.  We were consulted.    PAST MEDICAL HISTORY:        1. Aortic stenosis.        2. Depression.        3. Anxiety.        4. Chronic back pain.        5. GERD.        6. Hemorrhoids.        7. Hypertension.        8. Osteoporosis.        9. Rosacea.        10. Overactive bleeding.        11. Carotid stenosis.    PAST SURGICAL HISTORY:  Hysterectomy for benign disease.    MEDICATIONS PRIOR TO ADMISSION:        1. Premarin.        2. Multivitamin.        3. Calcium plus vitamin D.        4. Aspirin 81 mg.        5. Propranolol 20 mg b.i.d.        6. Amitriptyline 10 mg b.i.d.        7. Azithromycin 250 mg every Monday, Wednesday, and Friday.    ALLERGIES:  She has an allergy to ciprofloxacin, which causes headaches;  and to bee stings.    FAMILY HISTORY:  No known family history of lung disease.  Her father has  cerebrovascular disease.  A sister had rheumatic fever.    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  She lives at  home.  She apparently complains of dust, mold, and bat and bird droppings  in her basement.  She has 3 children.  She is a never-smoker and denies  alcohol or drug abuse.    REVIEW OF SYSTEMS:  As above.  In addition, no known coronary disease but  has the aortic stenosis not otherwise specified.  No known GI complaints  currently, no musculoskeletal complaints, no hematologic disease, no known  diabetes mellitus or thyroid disease, no known psychiatric issues.    PHYSICAL EXAMINATION:  On exam today, she is afebrile at 37.4, her blood  pressure is elevated at 175/75, heart rate 104, respiratory rate 26,  room-air saturation 94%.  She is a thin woman in no acute distress,  breathing comfortably. She has a supple neck and anicteric sclerae.  She  has decreased breath sounds at the right lung base.  Heart regular,  tachycardic, S1, S2, no murmur, rub, or gallop.  Abdomen is flat, soft, and  nontender, with no masses or tenderness.  Extremities have no edema, 2+  bilateral radial and pedal pulses.    LABORATORY DATA:  Her white blood cell count was 28,000, hematocrit 33,  creatinine 0.64.  She is hyponatremic with a sodium of 130, glucose 150.    IMPRESSION/PLAN:  In summary, this is an 76 year old never-smoking woman  who has had a chronic cough for several months, and bronchoscopy is  essentially negative.  She has been treated for bronchitis, it seems, with  Zithromax 3 times a week.  She has been started on broad-spectrum  antibiotics by her admitting service, with vancomycin, Zithromax, and  cefepime for her profound leukocytosis.  The large loculated right pleural  effusion is likely an empyema and may be amenable to a percutaneous drain,  which could be smaller and less uncomfortable than a large chest tube  placed by [place].  We would recommend initial drainage with this and send the  fluid for microbiology as well as cytology.  Should she have incomplete  evacuation, then we may offer  her a thoracoscopic decortication and  evacuation of the collection.  Please continue with DVT prophylaxis and  pulmonary toilet, out of bed, and aspiration precautions as well.                    Electronically Signed and Finalized by  Daphane Shepherd, MD 08/21/2009 08:55  ___________________________________________  Daphane Shepherd, MD  DD:   08/20/2009  DT:   08/20/2009  7:35 P  ZHY/QM#[id number]  [id number]        cc:   Daphane Shepherd, MD

## 2009-08-21 LAB — CBC AND DIFFERENTIAL
Baso # K/uL: 0.1 THOU/uL (ref 0.0–0.1)
Basophil %: 0.2 % (ref 0.1–1.2)
Eos # K/uL: 0.1 THOU/uL (ref 0.0–0.4)
Eosinophil %: 0.3 % — ABNORMAL LOW (ref 0.7–5.8)
Hematocrit: 31 % — ABNORMAL LOW (ref 34–45)
Hemoglobin: 10.1 g/dL — ABNORMAL LOW (ref 11.2–15.7)
Lymph # K/uL: 4.5 THOU/uL — ABNORMAL HIGH (ref 1.2–3.7)
Lymphocyte %: 22 % (ref 19.3–51.7)
MCV: 90 fL (ref 79–95)
Mono # K/uL: 1.9 THOU/uL — ABNORMAL HIGH (ref 0.2–0.9)
Monocyte %: 9.4 % (ref 4.7–12.5)
Neut # K/uL: 13.7 THOU/uL — ABNORMAL HIGH (ref 1.6–6.1)
Platelets: 609 THOU/uL — ABNORMAL HIGH (ref 160–370)
RBC: 3.4 MIL/uL — ABNORMAL LOW (ref 3.9–5.2)
RDW: 13.4 % (ref 11.7–14.4)
Seg Neut %: 67 % (ref 34.0–71.1)
WBC: 20.4 THOU/uL — ABNORMAL HIGH (ref 4.0–10.0)

## 2009-08-21 LAB — BASIC METABOLIC PANEL
Anion Gap: 13 (ref 7–16)
CO2: 22 mmol/L (ref 20–28)
Calcium: 7.3 mg/dL — ABNORMAL LOW (ref 8.6–10.2)
Chloride: 96 mmol/L (ref 96–108)
Creatinine: 0.55 mg/dL (ref 0.51–0.95)
GFR,Black: >59 *
GFR,Caucasian: >59 *
Glucose: 73 mg/dL — ABNORMAL LOW (ref 74–106)
Lab: 13 mg/dL (ref 6–20)
Potassium: 4.8 mmol/L (ref 3.3–5.1)
Sodium: 131 mmol/L — ABNORMAL LOW (ref 133–145)

## 2009-08-21 LAB — VANCOMYCIN, RANDOM: Vancomycin Level: 4.2 ug/mL

## 2009-08-21 LAB — AFB STAIN: AFB Stain: 0

## 2009-08-21 LAB — HOLD RED

## 2009-08-21 LAB — LACTATE DEHYDROGENASE: LD: 296 U/L — ABNORMAL HIGH (ref 118–225)

## 2009-08-21 LAB — AEROBIC CULTURE

## 2009-08-22 LAB — CBC AND DIFFERENTIAL
Baso # K/uL: 0.1 THOU/uL (ref 0.0–0.1)
Basophil %: 0.5 % (ref 0.1–1.2)
Eos # K/uL: 0.2 THOU/uL (ref 0.0–0.4)
Eosinophil %: 0.8 % (ref 0.7–5.8)
Hematocrit: 30 % — ABNORMAL LOW (ref 34–45)
Hemoglobin: 9.9 g/dL — ABNORMAL LOW (ref 11.2–15.7)
Lymph # K/uL: 3 THOU/uL (ref 1.2–3.7)
Lymphocyte %: 16.6 % — ABNORMAL LOW (ref 19.3–51.7)
MCV: 90 fL (ref 79–95)
Mono # K/uL: 1.8 THOU/uL — ABNORMAL HIGH (ref 0.2–0.9)
Monocyte %: 9.6 % (ref 4.7–12.5)
Neut # K/uL: 12.9 THOU/uL — ABNORMAL HIGH (ref 1.6–6.1)
Platelets: 532 THOU/uL — ABNORMAL HIGH (ref 160–370)
RBC: 3.3 MIL/uL — ABNORMAL LOW (ref 3.9–5.2)
RDW: 13.4 % (ref 11.7–14.4)
Seg Neut %: 71.2 % — ABNORMAL HIGH (ref 34.0–71.1)
WBC: 18.2 THOU/uL — ABNORMAL HIGH (ref 4.0–10.0)

## 2009-08-22 LAB — OSMOLALITY, URINE: Osmolality,UR: 544 mosm/kg (ref 150–1200)

## 2009-08-22 LAB — BASIC METABOLIC PANEL
Anion Gap: 11 (ref 7–16)
CO2: 20 mmol/L (ref 20–28)
Calcium: 7.4 mg/dL — ABNORMAL LOW (ref 8.6–10.2)
Chloride: 94 mmol/L — ABNORMAL LOW (ref 96–108)
Creatinine: 0.49 mg/dL — ABNORMAL LOW (ref 0.51–0.95)
GFR,Black: >59 *
GFR,Caucasian: >59 *
Glucose: 122 mg/dL — ABNORMAL HIGH (ref 74–106)
Lab: 10 mg/dL (ref 6–20)
Potassium: 4.2 mmol/L (ref 3.3–5.1)
Sodium: 125 mmol/L — ABNORMAL LOW (ref 133–145)

## 2009-08-22 LAB — SODIUM, URINE: Sodium,UR: 18 mEq/L

## 2009-08-22 LAB — VANCOMYCIN, TROUGH: Vancomycin Trough: 13.2 ug/mL (ref 10.0–20.0)

## 2009-08-22 LAB — AFB STAIN

## 2009-08-22 LAB — OSMOLALITY: Osmolality: 268 mosm/kg — ABNORMAL LOW (ref 278–305)

## 2009-08-22 LAB — FUNGUS CULTURE

## 2009-08-22 LAB — CREATININE, URINE: Creatinine,UR: 94 mg/dL

## 2009-08-22 LAB — HOLD RED

## 2009-08-22 NOTE — Consults (Addendum)
HISTORY OF PRESENT ILLNESS:  I was asked to see this 76 year old woman to  evaluate the possibility of recurrent aspiration causing her pulmonary  issues.    This lady has a longstanding history of a chronic cough that has been going  on since last August.  It became worse in November, and the patient  underwent an extensive evaluation with a CT scan, bronchoscopy and chest  x-ray. All were unrewarding except for mild changes of granularity seen on  the CT scan. The patient was treated with bronchodilators and antibiotics  and was admitted to the hospital on 08/20/2009 because of increasing  shortness of breath and worsening cough.  The cough is described as  sporadic, fairly continuous and fairly dry.  In the course of her  evaluation, the patient was noted to have a lesion within the right lung  that had an air fluid level.  Question of this being a loculated fluid  versus an empyema was entertained.  The patient underwent a percutaneous  interventional radiology drainage.  The cultures are pending.    In the interim, an extensive pulmonary workup for a variety of infectious  pathologies have been unremarkable.    The question was raised as to whether this patient is having intermittent  aspiration contributing to her cough and possible empyema.    The patient tells me that she has had a longstanding history of GERD and  describes this as heartburn.  There is no history of regurgitation,  coughing while eating, choking, aspirating or nocturnal cough.    The patient tells me she was told to have esophageal spasm and has had a  swallowing disorder ever since she can remember.  She describes this as  intermittent episodes of quite sudden inability to swallow and a feeling of  things being stuck in the esophagus.  At times, she has had to induce  vomiting. This occurs very infrequently now.  About 10 years ago, the  patient underwent and esophageal dilatation for this.  She is not quite  sure if this helped any.  She  says she has stayed away from certain things  that produce the spasm. These include rice and meats.    PAST MEDICAL HISTORY:  Remarkable for aortic stenosis, depression, chronic  anxiety, chronic back pain, GERD, hypertension, palpitations, osteoporosis,  rosacea, spastic bladder, carotid stenosis.    MEDICATIONS ON ADMISSION:     1. Premarin.     2. Multivitamins.     3. Aspirin.     4. Propranolol 20 mg daily.     5. Metoprolol 10 mg b.i.d.     6. A variety of sprays.     7. Azithromycin.    FAMILY HISTORY:  Unremarkable for lung disease.    The patient tells me that she has no appetite and has lost quite a lot of  weight in recent months.  She admits to fevers, none documented.    PHYSICAL EXAMINATION:  Physical evaluation reveals a cachetic-looking woman  sitting up in a chair complaining of being slightly short of breath.  Her  family tells me that she has been very anxious today.  Blood pressure  140/70, pulse 80 per minute.  Head and neck examination is remarkable only  for pallor.  Neck veins are not distended.  Lung sounds are diminished on  the right side.  Heart sounds are well heard.  The abdomen is soft without  focal tenderness, guarding, rigidity or mass. Bowel sounds are active.  Extremities are unremarkable.    ADMITTING LABWORK:  Remarkable for a white count of 20,000 on the 8th,  which has dropped to 18.2.  Hematocrit is stable at 30.  The Chem-12 is  remarkable for hyponatremia with a serum sodium of 125.  Pending cytology  from the aspirate from the lung.  A variety of infectious serology,  including coccidioidomycosis, histoplasma, mycoplasma were all negative.    COMMENT:  This woman has a longstanding history of GERD and has a history  suggestive of esophageal spasms. These symptoms have remained unchanged  over the last many years.  There seems to be no clinical correlation  between the patient's heartburn and her episodes of coughing nor is there  any symptomatology when the patient eats.   I believe it would be hard to  prove that the esophagus has any role in the patient's present development  of an empyema.  In any event, the best test to do to evaluate this  possibility would be a swallowing evaluation and a barium upper GI series.  I see no role for an upper endoscopy at this point.                      Electronically Signed and Finalized by  Katrinka Blazing, MD 09/11/2009 18:15  ___________________________________________  Katrinka Blazing, MD  DD:   08/22/2009  DT:   08/22/2009  3:37 P  [id number]  [id number]        cc:   Gaye Pollack, MD        Katrinka Blazing, MD

## 2009-08-23 LAB — CBC AND DIFFERENTIAL
Baso # K/uL: 0.1 THOU/uL (ref 0.0–0.1)
Basophil %: 0.2 % (ref 0.1–1.2)
Eos # K/uL: 0 THOU/uL (ref 0.0–0.4)
Eosinophil %: 0 % — ABNORMAL LOW (ref 0.7–5.8)
Hematocrit: 33 % — ABNORMAL LOW (ref 34–45)
Hemoglobin: 10.7 g/dL — ABNORMAL LOW (ref 11.2–15.7)
Lymph # K/uL: 3 THOU/uL (ref 1.2–3.7)
Lymphocyte %: 11.5 % — ABNORMAL LOW (ref 19.3–51.7)
MCV: 93 fL (ref 79–95)
Mono # K/uL: 1.7 THOU/uL — ABNORMAL HIGH (ref 0.2–0.9)
Monocyte %: 6.3 % (ref 4.7–12.5)
Neut # K/uL: 21.2 THOU/uL — ABNORMAL HIGH (ref 1.6–6.1)
Platelets: 647 THOU/uL — ABNORMAL HIGH (ref 160–370)
RBC: 3.6 MIL/uL — ABNORMAL LOW (ref 3.9–5.2)
RDW: 13.3 % (ref 11.7–14.4)
Seg Neut %: 81 % — ABNORMAL HIGH (ref 34.0–71.1)
WBC: 26.2 THOU/uL — ABNORMAL HIGH (ref 4.0–10.0)

## 2009-08-23 LAB — BASIC METABOLIC PANEL
Anion Gap: 17 — ABNORMAL HIGH (ref 7–16)
CO2: 20 mmol/L (ref 20–28)
Calcium: 7.7 mg/dL — ABNORMAL LOW (ref 8.6–10.2)
Chloride: 94 mmol/L — ABNORMAL LOW (ref 96–108)
Creatinine: 0.55 mg/dL (ref 0.51–0.95)
GFR,Black: >59 *
GFR,Caucasian: >59 *
Glucose: 85 mg/dL (ref 74–106)
Lab: 10 mg/dL (ref 6–20)
Potassium: 5.1 mmol/L (ref 3.3–5.1)
Sodium: 131 mmol/L — ABNORMAL LOW (ref 133–145)

## 2009-08-23 LAB — AEROBIC CULTURE

## 2009-08-23 LAB — MEDICAL CYTOLOGY

## 2009-08-23 LAB — POCT GLUCOSE: Glucose POCT: 183 mg/dL — ABNORMAL HIGH (ref 74–106)

## 2009-08-24 ENCOUNTER — Ambulatory Visit: Payer: Self-pay | Admitting: Internal Medicine

## 2009-08-24 ENCOUNTER — Other Ambulatory Visit: Payer: Self-pay | Admitting: Cardiology

## 2009-08-24 LAB — BASIC METABOLIC PANEL
Anion Gap: 8 (ref 7–16)
CO2: 26 mmol/L (ref 20–28)
Calcium: 7.8 mg/dL — ABNORMAL LOW (ref 8.6–10.2)
Chloride: 95 mmol/L — ABNORMAL LOW (ref 96–108)
Creatinine: 0.54 mg/dL (ref 0.51–0.95)
GFR,Black: >59 *
GFR,Caucasian: >59 *
Glucose: 93 mg/dL (ref 74–106)
Lab: 10 mg/dL (ref 6–20)
Potassium: 5 mmol/L (ref 3.3–5.1)
Sodium: 129 mmol/L — ABNORMAL LOW (ref 133–145)

## 2009-08-24 LAB — ALBUMIN: Albumin: 2.3 g/dL — ABNORMAL LOW (ref 3.5–5.2)

## 2009-08-24 LAB — HOLD RED

## 2009-08-24 LAB — LACTATE DEHYDROGENASE, BODY FLUID: LD,FL: 64 U/L

## 2009-08-24 LAB — PROTEIN, TOTAL: Total Protein: 5.5 g/dL — ABNORMAL LOW (ref 6.3–7.7)

## 2009-08-24 LAB — PH, BODY FLUID: pH,FL: 7.6

## 2009-08-24 LAB — VANCOMYCIN, TROUGH: Vancomycin Trough: 16.7 ug/mL (ref 10.0–20.0)

## 2009-08-24 LAB — GLUCOSE, BODY FLUID: Glucose,FL: 97 mg/dL

## 2009-08-24 LAB — LACTATE DEHYDROGENASE: LD: 178 U/L (ref 118–225)

## 2009-08-24 LAB — HOLD MISC. FLUIDS

## 2009-08-25 LAB — ANAEROBIC CULTURE: Anaerobic Culture: 0

## 2009-08-25 LAB — BLOOD CULTURE

## 2009-08-25 LAB — AFB STAIN: AFB Stain: 0

## 2009-08-26 LAB — CBC AND DIFFERENTIAL
Baso # K/uL: 0.1 THOU/uL (ref 0.0–0.1)
Basophil %: 0.6 % (ref 0.1–1.2)
Eos # K/uL: 0.4 THOU/uL (ref 0.0–0.4)
Eosinophil %: 1.6 % (ref 0.7–5.8)
Hematocrit: 30 % — ABNORMAL LOW (ref 34–45)
Hemoglobin: 9.9 g/dL — ABNORMAL LOW (ref 11.2–15.7)
Lymph # K/uL: 3.1 THOU/uL (ref 1.2–3.7)
Lymphocyte %: 14.3 % — ABNORMAL LOW (ref 19.3–51.7)
MCV: 91 fL (ref 79–95)
Mono # K/uL: 2.2 THOU/uL — ABNORMAL HIGH (ref 0.2–0.9)
Monocyte %: 10.2 % (ref 4.7–12.5)
Neut # K/uL: 15.5 THOU/uL — ABNORMAL HIGH (ref 1.6–6.1)
Platelets: 535 THOU/uL — ABNORMAL HIGH (ref 160–370)
RBC: 3.3 MIL/uL — ABNORMAL LOW (ref 3.9–5.2)
RDW: 13.7 % (ref 11.7–14.4)
Seg Neut %: 72.4 % — ABNORMAL HIGH (ref 34.0–71.1)
WBC: 21.4 THOU/uL — ABNORMAL HIGH (ref 4.0–10.0)

## 2009-08-26 LAB — LEGIONELLA CULTURE: Legionella Culture: 0

## 2009-08-26 LAB — BASIC METABOLIC PANEL
Anion Gap: 9 (ref 7–16)
CO2: 27 mmol/L (ref 20–28)
Calcium: 7.8 mg/dL — ABNORMAL LOW (ref 8.6–10.2)
Chloride: 96 mmol/L (ref 96–108)
Creatinine: 0.5 mg/dL — ABNORMAL LOW (ref 0.51–0.95)
GFR,Black: >59 *
GFR,Caucasian: >59 *
Glucose: 101 mg/dL (ref 74–106)
Lab: 9 mg/dL (ref 6–20)
Potassium: 4.3 mmol/L (ref 3.3–5.1)
Sodium: 132 mmol/L — ABNORMAL LOW (ref 133–145)

## 2009-08-27 LAB — CBC AND DIFFERENTIAL
Baso # K/uL: 0.1 THOU/uL (ref 0.0–0.1)
Basophil %: 0.8 % (ref 0.1–1.2)
Eos # K/uL: 0.4 THOU/uL (ref 0.0–0.4)
Eosinophil %: 2 % (ref 0.7–5.8)
Hematocrit: 31 % — ABNORMAL LOW (ref 34–45)
Hemoglobin: 9.8 g/dL — ABNORMAL LOW (ref 11.2–15.7)
Lymph # K/uL: 3.3 THOU/uL (ref 1.2–3.7)
Lymphocyte %: 18.8 % — ABNORMAL LOW (ref 19.3–51.7)
MCV: 92 fL (ref 79–95)
Mono # K/uL: 2 THOU/uL — ABNORMAL HIGH (ref 0.2–0.9)
Monocyte %: 11.2 % (ref 4.7–12.5)
Neut # K/uL: 11.8 THOU/uL — ABNORMAL HIGH (ref 1.6–6.1)
Platelets: 496 THOU/uL — ABNORMAL HIGH (ref 160–370)
RBC: 3.3 MIL/uL — ABNORMAL LOW (ref 3.9–5.2)
RDW: 14.1 % (ref 11.7–14.4)
Seg Neut %: 66.1 % (ref 34.0–71.1)
WBC: 17.8 THOU/uL — ABNORMAL HIGH (ref 4.0–10.0)

## 2009-08-27 LAB — TB DIRECT MOLECULAR DETECTION: TB Direct Molecular Detection: 0

## 2009-08-27 LAB — PROTEIN ELECT,FLUID
Albumin %,Fl: 47.6 %
Albumin,Fl: 0.5 g/dL
Alpha 1%,Fl: 8.6 %
Alpha 1,Fl: 0.1 g/dL
Alpha 2%, Fl: 9.7 %
Alpha 2, Fl: 0.1 g/dL
Beta %, Fl: 13.4 %
Beta,Fl: 0.1 g/dL
Gamma %,Fl: 20.7 %
Gamma,Fl: 0.2 g/dL
Protein,FL: 1.1 g/dL

## 2009-08-27 LAB — BASIC METABOLIC PANEL
Anion Gap: 12 (ref 7–16)
CO2: 25 mmol/L (ref 20–28)
Calcium: 7.8 mg/dL — ABNORMAL LOW (ref 8.6–10.2)
Chloride: 96 mmol/L (ref 96–108)
Creatinine: 0.55 mg/dL (ref 0.51–0.95)
GFR,Black: >59 *
GFR,Caucasian: >59 *
Glucose: 90 mg/dL (ref 74–106)
Lab: 8 mg/dL (ref 6–20)
Potassium: 4.5 mmol/L (ref 3.3–5.1)
Sodium: 133 mmol/L (ref 133–145)

## 2009-08-29 LAB — ALT: ALT: 14 U/L (ref 0–35)

## 2009-08-29 LAB — LEGIONELLA CULTURE: Legionella Culture: 0

## 2009-09-01 NOTE — Discharge Instructions (Signed)
[id number] Name: Kristine [name], Kristine [name]  Medical Record Number:     [id number]  Admission Date:                 08/20/2009  Attending Physician:       Rudolpho Sevin, MD      PATIENT DISCHARGE MEDICATION LIST        Amitriptyline (10 mg Tablet): By Mouth Every Night at Bedtime    Amox Tr/Potassium Clavulanate (875 MG Tablet): 875 mg By Mouth       twice a day 10 day course    Aspir-81 (Aspirin) (81 mg): By Mouth Once Each Day    AZELASTINE HCL (137 mcg): NASAL twice a day    Calcium Carbonate-Vit D3-Min (600 mg-400 unit Tablet): By Mouth       Once Each Day    Furosemide (20 mg Tablet): 1 Tab By Mouth Every Morning    Lisinopril (5MG  Tablet): 2.5 mg By Mouth Once Each Day    MULTIVITAMIN (Tablet): By Mouth Once Each Day    PREMARIN (CONJUGATED ESTROGENS) (0.625 mg Tablet): By Mouth Once       Each Day    Propranolol HCL (20 mg Tablet): By Mouth twice a day    Guaifenesin (100MG /5ML Syrup): 400 mg By Mouth Every 4 hours As       Needed for COUGH    Lorazepam (0.5 MG Tablet): 0.5 mg By Mouth three times a day As       Needed for ANXIETY    Miconazole Nitrate (1 LAYER Cream): 1 Layer Topical three times       a day As Needed for PRURITIS        PLEASE STOP THESE MEDICATIONS        Azithromycin (250 mg Tablet): By Mouth qMWF            Assessment Performed Code       FINAL  Primary Diagnosis:    Empyema    Other Diagnoses:      MAC contaminant    Brief Hospital Course:      Admitted with worsened chronic cough, found to have large right      pleural effusion, for which a catheter was used to drain.  You were      given IV and oral antibiotics for this empyema.  A sputum culture grew       mycobacteria and you were started on antibiotics in case this would      be TB but after PCR and culture confirmed no TB, these were      discontinued.  You were started on lisinopril and furosemide to help      keep the fluid from re-accumulating on the lung.                [phone]  Follow-Up Visits:    DOCTOR NAME:               PHONE:             APPOINTMENT:      ALAGAPPAN ,VYTHILINGAM    [phone]       4 - 6 Days      FINE ,STEVEN M            [phone]       10 Days    Discharge Destination:      Home    Discharge Condition:      Satisfactory    Diet:  Regular, no limitations    Activity:      Increase walking as tolerated    General Comments      There is no need for any isolation precautions.  The culture showed      only a contaminant (no TB).    Notify Physician if you notice:      Chest pain      Shortness of breath      Nausea, vomiting, diarrhea      Shaking or Chills      Constipation      Temperature greater than 101 degrees F for more than 24 hours    Tests:      TEST:            WHEN/OTHER INSTRUCTIONS:        REASON FOR TEST:      BMP              1 week                          Started lisinopril      Chest X-Ray      every week for 4 weeks          Follow empyema    Change Bandage:      in 1 week, home nurse should change bandage on rib    Person Dx with AMI?      No    Person Dx with CHF?      Yes    Impaired LV function?      Yes    Discharged on ACEI/ARB:      Yes    Electronically signed by:      Pryor Curia      Electronically signed and finalized by                  DD:   08/31/2009  DT:   09/01/2009  6:00 A  DVI:  [id number]    cc:   Gaye Pollack, MD

## 2009-09-02 ENCOUNTER — Emergency Department
Admit: 2009-09-02 | Disposition: A | Discharge status: Home / Self Care | Payer: Self-pay | Source: Ambulatory Visit | Attending: Emergency Medicine | Admitting: Emergency Medicine

## 2009-09-02 LAB — BASIC METABOLIC PANEL
Anion Gap: 10 (ref 7–16)
CO2: 27 mmol/L (ref 20–28)
Calcium: 8.5 mg/dL — ABNORMAL LOW (ref 8.6–10.2)
Chloride: 96 mmol/L (ref 96–108)
Creatinine: 0.56 mg/dL (ref 0.51–0.95)
GFR,Black: >59 *
GFR,Caucasian: >59 *
Glucose: 116 mg/dL — ABNORMAL HIGH (ref 74–106)
Lab: 6 mg/dL (ref 6–20)
Potassium: 4.2 mmol/L (ref 3.3–5.1)
Sodium: 133 mmol/L (ref 133–145)

## 2009-09-02 LAB — CBC AND DIFFERENTIAL
Baso # K/uL: 0.1 THOU/uL (ref 0.0–0.1)
Basophil %: 0.6 % (ref 0.1–1.2)
Eos # K/uL: 0.2 THOU/uL (ref 0.0–0.4)
Eosinophil %: 1.5 % (ref 0.7–5.8)
Hematocrit: 27 % — ABNORMAL LOW (ref 34–45)
Hemoglobin: 9.3 g/dL — ABNORMAL LOW (ref 11.2–15.7)
Lymph # K/uL: 3.3 THOU/uL (ref 1.2–3.7)
Lymphocyte %: 23.2 % (ref 19.3–51.7)
MCV: 91 fL (ref 79–95)
Mono # K/uL: 1.9 THOU/uL — ABNORMAL HIGH (ref 0.2–0.9)
Monocyte %: 12.8 % — ABNORMAL HIGH (ref 4.7–12.5)
Neut # K/uL: 8.8 THOU/uL — ABNORMAL HIGH (ref 1.6–6.1)
Platelets: 478 THOU/uL — ABNORMAL HIGH (ref 160–370)
RBC: 3 MIL/uL — ABNORMAL LOW (ref 3.9–5.2)
RDW: 15 % — ABNORMAL HIGH (ref 11.7–14.4)
Seg Neut %: 61.4 % (ref 34.0–71.1)
WBC: 14.4 THOU/uL — ABNORMAL HIGH (ref 4.0–10.0)

## 2009-09-02 LAB — HOLD BLUE

## 2009-09-02 LAB — HOLD RED

## 2009-09-02 LAB — HOLD GREEN WITH GEL

## 2009-09-02 LAB — BLOOD BANK HOLD PINK

## 2009-09-03 ENCOUNTER — Other Ambulatory Visit: Payer: Self-pay | Admitting: Gastroenterology

## 2009-09-03 ENCOUNTER — Ambulatory Visit
Admit: 2009-09-03 | Discharge: 2009-09-03 | Disposition: A | Discharge status: Home / Self Care | Payer: Self-pay | Source: Ambulatory Visit | Attending: Student in an Organized Health Care Education/Training Program | Admitting: Student in an Organized Health Care Education/Training Program

## 2009-09-07 ENCOUNTER — Ambulatory Visit: Payer: Self-pay | Admitting: Internal Medicine

## 2009-09-10 ENCOUNTER — Ambulatory Visit
Admit: 2009-09-10 | Discharge: 2009-09-10 | Disposition: A | Discharge status: Home / Self Care | Payer: Self-pay | Source: Ambulatory Visit | Attending: Student in an Organized Health Care Education/Training Program | Admitting: Student in an Organized Health Care Education/Training Program

## 2009-09-10 DIAGNOSIS — J90 Pleural effusion, not elsewhere classified: Secondary | ICD-10-CM

## 2009-09-10 NOTE — Progress Notes (Signed)
Reason For Visit   HH F/U.  HPI   Patient has a history of chronic cough for the past 3 months or so.    Patient had extensive investigations and had CAT scan of the chest and back   in November of 2010.  It showed possible right lower lobe pneumonia and   patient had been treated with different courses of antibiotics including   Avelox.  Patient did not have significant improvement despite all such   treatment and therefore was sent for a pulmonary consult with Dr. Tama High    in [facility].  Patient had further extensive investigations   including Spirometry  and fiber-optic bronchoscopy.     there was always a clinical suspicion for either typical or atypical   Mycobacterium infection.  Patient was seldom able to produce enough sputum   and the regular smear and culture for tuberculosis came negative initially.     Because of continuing cough and high fever patient was admitted to [facility].  Because of the high clinical suspicion of tuberculosis the   patient was in the isolation unit.  There was a time when the direct smear   of the sputum was positive for mycobacterial infection.  However the   complementary test in [place] was negative.patient therefore was ready to be   healed and did not have to have the isolation precautions.patient had   further CAT scan of the chest which showed empyema in the right lower zone.    Patient required pigtail catheter placement.  Patient is now going for   serial chest x-rays every Monday and continues to have possible abscess in   the right lower lobe still.  The catheter has been removed several days ago.     I now see a culture report which grew Mycobacterium avium intracellulare   complex     patient was in a sort of denial reaction especially her husband was in   severe denial.     the [facility] health nurses had gone to her house.  Patient has   several bird houses and used to always enjoyed feeding the birds.  These   bird droppings and  othercontaminants are closer to the air conditioner and   there is a suspicion that this may be responsible for the present   infection.  Patient is very upset.  They are trying to arrange for an   environmental testing agency to come and do further testing and cleanup.     Patient's daughter from [place] and son from around a bit here to help   the patient and her husband.  Patient is extremely active and does not like   what is happening to her.     The patient has lost almost in excess of 15 pounds and looked very thin.     Because of mild hypertension patient was treated with lisinopril in small   doses in the hospital.  Patient does not think that this is adding to her   cough.     Patient also is continuing on hydrocodone-containing cough syrup.     Patient continues to be anxious and is using lorazepam.     Patient is on Augmentin.     Patient denies any chest pain.  Patient has a cough which is predominantly   a dry mouth.  No hemoptysis.  Bowel movements have been normal.  No major   weakness or numbness.     Medications and  allergies are as in the chart.     Patient is in a very old home with her husband.  Her husband is extremely   caring.  No history of smoking.  Patient is to be drinking alcohol in the   distant past but not currently.  No recreational drug use at any time.  Allergies   Bee sting  Cipro TABS; Headache.  Current Meds   ** Medication reconciliation completed. **.  Premarin 0.625 MG Tablet;TAKE 1 TABLET DAILY.; RPT  Multivitamins Tablet;TAKE 1 TABLET DAILY.; RPT  Calcium 600 + D 600-200 MG-UNIT TABS;TAKE 1 TABLET DAILY.; RPT  Nitroglycerin 0.4 MG SUBL;DISSOLVE 1 TABLET UNDER THE TONGUE AS NEEDED FOR   CHEST PAIN.; Rx  Aspirin 81 MG Tablet;TAKE 2 TABLET DAILY; Rx  Anucort-HC 25 MG Suppository;INSERT AS DIRECTED; Rx  Propranolol HCl 20 MG Tablet;TAKE 1 TABLET TWICE DAILY.; Rx  Astepro 0.15 % Solution;1 spray each nostril bid; RPT  Amitriptyline HCl 10 MG Tablet;TAKE ONE TABLET AT BEDTIME;  Rx  Lisinopril 2.5 MG Tablet;TAKE 1 TABLET DAILY.; Rx  Miconazole Nitrate 2 % Cream;APPLY SPARINGLY TO AFFECTED AREA(S) TWICE   DAILY; Rx  Furosemide 20 MG Tablet;TAKE 1 TABLET DAILY.; Rx  Cheratussin AC 100-10 MG/5ML Syrup;TAKE 1 TEASPOONFUL EVERY 4 HOURS AS   NEEDED; Rx  LORazepam 0.5 MG Tablet;TAKE 1 TABLET 3 TIMES DAILY AS NEEDED FOR ANXIETY;   Rx  Augmentin 875-125 MG Tablet;TAKE 1 TABLET TWICE DAILY AFTER MEALS FOR 10   DAYS.; RPT.  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  Vital Signs   Recorded by dpope on 07 Sep 2009 09:01 AM  BP:136/78,  LUE,  Sitting,   HR: 110 b/min,   Temp: 98.3 F,   Weight: 139 lb,   O2 Sat: 98 (%SpO2),  RA.  Physical Exam   GENERAL APPEARANCE: extremely thin built  in looking  patient has a mask on the face.  MENTAL STATUS: Appears alert and oriented. depressed looking.  SKIN: Skin color and turgor normal. No suspicious lesions, masses, rashes,   or ulcerations. Nails and hair appear normal.  HEAD: Normocephalic.  EARS: External ear w/o scars, masses, or lesions. External auditory canal   intact, clear, and w/o lesions. TMs intact with normal light reflex and   landmarks. Acuity to conversational tones good.  EYES: PERRLA, extraocular movements intact. Lids w/o defect, conjunctiva   and sclera appear normal. Fundi w/o papilledema, hemorrhage, exudates, or   arterial abnormalities.  NOSE: Nasal mucosa and turbinates pink, septum midline, no lesions.  MOUTH: Teeth in good repair. Gums pink w/o lesions. Normal appearing   mucosa, palate, and tongue.   OROPHARYNX: Moist w/o exudate, erythema, or swelling.  NECK: Symmetric, trachea midline. Full ROM w/o pain  or tenderness. Thyroid   nontender w/o enlargement or masses. Carotid pulses normal with no bruits.   No cervical lymphadenopathy.  BREASTS: not needed to be checked  CHEST: patient has evidence of right lower lobe consolidation with dullness   posteriorly patient has a marked off the pig tail catheter in the posterior   axillary line  CV: normal heart sounds.  has tachycardia.  No significant heart murmurs.  GI/ABDOMEN: Abdomen soft with normal bowel sounds.  No guarding or rebound.   No palpable masses or tenderness. Liver and spleen are w/o tenderness or   enlargement. No aortic widening. No inguinal adenopathy.  GU:not examined today.  RECTAL: not examined today.  MS: Muscle tone and strength normal for age, w/o atrophy or abnormal   movement.  EXTREMITIES: Joints w/full ROM, w/o tenderness, crepitus, or contracture.   No obvious joint deformities or effusions.  NEUROLOGICAL: no focal deficits.  Assessment   Mycobacterium avium intracellulare  complex infection:patient had her   discretion spanning 45 minutes on this topic.  Patient's husband was in   quite a degree of denial saying that patient does not have any   tuberculosis.  Information on these topics provided.  Patient should   definitely get the environmental inspection done at home.  Patient should   continue on the Augmentin.  My concerns about continuing abscess formation   in the right lower lobe most told to the patient.  Patient has an   appointment with the pulmonologist in the next week which she should keep.    Patient also should confine herself at home.  Patient feels very tired and   weak to start off with.  Patient usually is otherwise fairly active.    Patient may require further drainage if the abscess continues.  Patient has   lost a lot of weight.  Patient's appetite is trying to return.     Hypertension: Patient's blood pressure is extremely well controlled.    Patient is on a small dose of lisinopril which can cause a cough by itself.     I was not very sure if she needs to continue on this.  We can always   choose a different antihypertensive in case needed.  However patient did   not want to make a change right now.  Patient is aware of cough as a side   effect of this medication and I would rather have her on something it will   not cause any further confusion to the etiology of her cough.     Anxiety: Patient should continue on the anxiolytics.     Palpitations: Patient has tachycardia but otherwise regular tachycardia   only.  This is possibly sinus rhythm only.  Patient was reassured.  If it   continues to bother her we will get Holter monitoring done.     Weight loss: This is possibly related to the recent hospitalization and   poor nutritional intake.  Patient hopefully will improve eating.     Depression: The patient has reactive depression.  Patient is not keen on   starting on any medication.  Patient is now trying to use the hydrocodone   cough syrup one or more.  Patient was advised on the addiction potential of   this medication.     .  Plan   Follow-up: In one month or earlier if needed.  All their questions were   invited and answered to their satisfaction. patient was advised to  Follow   up with the pulmonologist and the infectious disease specialist if required.  Signature   Electronically signed by: Gaye Pollack  M.D.; 09/10/2009 5:34 AM   EST.

## 2009-09-11 ENCOUNTER — Ambulatory Visit
Admit: 2009-09-11 | Discharge: 2009-09-11 | Disposition: A | Discharge status: Home / Self Care | Payer: Self-pay | Source: Ambulatory Visit | Attending: Pulmonology | Admitting: Pulmonology

## 2009-09-11 ENCOUNTER — Encounter: Payer: Self-pay | Admitting: Infectious Diseases

## 2009-09-13 LAB — AFB STAIN

## 2009-09-13 LAB — FUNGUS CULTURE: Fungal Culture: 0

## 2009-09-13 LAB — AFB CULTURE

## 2009-09-13 LAB — AEROBIC CULTURE

## 2009-09-14 ENCOUNTER — Ambulatory Visit: Payer: Self-pay | Admitting: Internal Medicine

## 2009-09-22 LAB — AFB CULTURE: AFB Culture: 0

## 2009-09-28 ENCOUNTER — Ambulatory Visit: Payer: Self-pay | Admitting: Internal Medicine

## 2009-09-29 NOTE — Progress Notes (Signed)
Reason For Visit   Hemorrhoids bleeding. Suppository not effective. Poor appetite.  HPI   Patient notices bright red blood on the toilet tissue.  Patient also claims   that the stool discolored red and she is on rifampicin.patient has been   applying Anusol a.c. suppositories which is not completely helping her.     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  Patient is quite upset about the fact that people   may isolate her because of a possible diagnosis of tuberculosis and she is   in quite a degree of denial.  This applies to her husband also who   repeatedly claims that she does not have tuberculosis.     Patient does not have any fever or chills.  Patient's appetite is still   down despite eating better.  Patient's weight has gone down by another 3   pounds.     Patient along with her husband have a large collection of bird nests  which   are located near their air conditioner vent.  The in the process of   relocating them though that has not happened as yet.     patient remains quite anxious as usual.     Patient has started taking Remeron at 15 mg at bedtime it does not   completely help her as yet.     Patient sees both the pulmonologist and the infectious diseases specialist.     The patient has been recently started on a smaller dose of fosinopril but   claims that she is not having any major side effects like cough from this.    Patient is also on a small dose of propanolol for anxiety related   palpitations.     Patient continues to have problems with her sleep because of the cough and   anxiety.     Medications allergies were asked in the chart.     Patient seems to be quite depressed from all that is happening around her   period     denies any chest pain.  Patient has a mild shortness of breath.  No major   weakness or numbness     medications and allergies are as in the chart.     Patient lives with her husband.  He usually comes in for call patient's    appointments but today he did not come with her.  Patient feels that she is   exhausting him.  Allergies   Bee sting  Cipro TABS; Headache.  Current Meds   ** Medication reconciliation completed. **.  Premarin 0.625 MG Tablet;TAKE 1 TABLET DAILY.; RPT  Multivitamins Tablet;TAKE 1 TABLET DAILY.; RPT  Calcium 600 + D 600-200 MG-UNIT TABS;TAKE 1 TABLET DAILY.; RPT  Nitroglycerin 0.4 MG SUBL;DISSOLVE 1 TABLET UNDER THE TONGUE AS NEEDED FOR   CHEST PAIN.; Rx  Aspirin 81 MG Tablet;TAKE 2 TABLET DAILY; Rx  Anucort-HC 25 MG Suppository;INSERT AS DIRECTED; Rx  Propranolol HCl 20 MG Tablet;TAKE 1 TABLET TWICE DAILY.; Rx  Astepro 0.15 % Solution;1 spray each nostril bid; RPT  Amitriptyline HCl 10 MG Tablet;TAKE ONE TABLET AT BEDTIME; Rx  Lisinopril 2.5 MG Tablet;TAKE 1 TABLET DAILY.; Rx  Miconazole Nitrate 2 % Cream;APPLY SPARINGLY TO AFFECTED AREA(S) TWICE   DAILY; Rx  Furosemide 20 MG Tablet;TAKE 1 TABLET DAILY.; Rx  Cheratussin AC 100-10 MG/5ML Syrup;TAKE 1 TEASPOONFUL EVERY 4 HOURS AS   NEEDED; Rx  LORazepam 0.5 MG Tablet;TAKE 1 TABLET 3 TIMES  DAILY AS NEEDED FOR ANXIETY;   Rx  Mirtazapine 15 MG Tablet;TAKE 1 TABLET AT BEDTIME.; Rx.  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  Vital Signs   Recorded by Pope,Debra on 28 Sep 2009 10:34 AM  BP:142/62,  RUE,  Sitting  Recorded by Pope,Debra on 28 Sep 2009 10:29 AM  HR: 90 b/min,   Temp: 99.2 F,   Weight: 136 lb,   O2 Sat: 96 (%SpO2),  RA.  Physical Exam   GENERAL APPEARANCE: Appears stated age, well appearing   patient is thin built now.        HEENT: PERRL, EOMI, TMs normal, oropharynx clear  LUNGS:patient has mild reduced intensity of her it  sounds in the right   lower base but this is much better than previously.  HEART: Normal S1,S2 without murmurs, gallops, or rubs  ABDOMEN: NABS, soft, non-tender, without hepato-splenomegaly.rectal exam   showed perianal excoriation which may be causing the redness on the toilet   tissue as well a pink stool which was guaiac-negative.  EXTREMITIES: Without clubbing, cyanosis, or edema  NEUROLOGIC: Alert and oriented x3, cranial nerves II-XII intact,   motor/sensory exam normal, DTRs symmetric, normal gait.  Assessment   Perianal pain: patient was advised to do sitz baths.  Patient was advised   to get Proctosol cream and applied with an applicator inside the anus and   on the outside.  Patient should do the sitz bath at least two times a day.    Patient was advised to consider going to a colorectal surgeon if this   problem does not settle down.  Patient was reassured that the guaiac test   was negative and her red colored stool may be a reflection of being on   rifampicin.  Patient may be able to come off this medication in the very   near future.     Mycobacterium avium intracellular infection: Patient is otherwise a healthy   host.  Patient is on rifampicin now.  Patient may stop this in the next few   weeks.  Patient was advised to continue with regular follow-ups with the   pulmonologist and the infectious disease specialist.     anxiety: Patient shall continue with the anxiolytics.  Patient is on   propranolol this helps to take care of her palpitations.     Weight loss: Patient was advised to take high-calorie supplements.  Patient   was encouraged to eat better.     Reactive depression: Patient has insomnia from this.  Patient is on Tylenol   and age hopefully should help with her sleep as well increasing her   appetite.  Plan   Follow-up: As previously planned or earlier if required.  Patient was given   all the information in writing as usual.  Signature   Electronically signed by: Gaye Pollack   M.D.; 09/29/2009 4:26 PM   EST.

## 2009-10-03 ENCOUNTER — Ambulatory Visit
Admit: 2009-10-03 | Discharge: 2009-10-03 | Disposition: A | Discharge status: Home / Self Care | Payer: Self-pay | Source: Ambulatory Visit | Attending: Internal Medicine | Admitting: Internal Medicine

## 2009-10-03 LAB — AFB CULTURE

## 2009-10-04 LAB — AEROBIC CULTURE: Aerobic Culture: 0

## 2009-10-05 ENCOUNTER — Encounter: Payer: Self-pay | Admitting: Infectious Diseases

## 2009-10-05 ENCOUNTER — Ambulatory Visit
Admit: 2009-10-05 | Discharge: 2009-10-05 | Disposition: A | Discharge status: Home / Self Care | Payer: Self-pay | Source: Ambulatory Visit | Attending: Family Medicine | Admitting: Family Medicine

## 2009-10-05 DIAGNOSIS — J90 Pleural effusion, not elsewhere classified: Secondary | ICD-10-CM

## 2009-10-12 ENCOUNTER — Ambulatory Visit: Payer: Self-pay | Admitting: Internal Medicine

## 2009-10-13 LAB — AFB CULTURE: AFB Culture: 0

## 2009-10-17 LAB — AFB CULTURE: AFB Culture: 0

## 2009-10-18 ENCOUNTER — Ambulatory Visit
Admit: 2009-10-18 | Discharge: 2009-10-18 | Disposition: A | Discharge status: Home / Self Care | Payer: Self-pay | Source: Ambulatory Visit | Attending: Family | Admitting: Family

## 2009-10-18 LAB — CRP: CRP: 15 mg/L — ABNORMAL HIGH (ref 0–10)

## 2009-10-18 LAB — CBC AND DIFFERENTIAL
Baso # K/uL: 0 THOU/uL (ref 0.0–0.1)
Basophil %: 0.5 % (ref 0.1–1.2)
Eos # K/uL: 0.3 THOU/uL (ref 0.0–0.4)
Eosinophil %: 3 % (ref 0.7–5.8)
Hematocrit: 33 % — ABNORMAL LOW (ref 34–45)
Hemoglobin: 10.7 g/dL — ABNORMAL LOW (ref 11.2–15.7)
Lymph # K/uL: 2.5 THOU/uL (ref 1.2–3.7)
Lymphocyte %: 30.2 % (ref 19.3–51.7)
MCV: 93 fL (ref 79–95)
Mono # K/uL: 1 THOU/uL — ABNORMAL HIGH (ref 0.2–0.9)
Monocyte %: 11.8 % (ref 4.7–12.5)
Neut # K/uL: 4.5 THOU/uL (ref 1.6–6.1)
Platelets: 300 THOU/uL (ref 160–370)
RBC: 3.6 MIL/uL — ABNORMAL LOW (ref 3.9–5.2)
RDW: 14.9 % — ABNORMAL HIGH (ref 11.7–14.4)
Seg Neut %: 54.5 % (ref 34.0–71.1)
WBC: 8.2 THOU/uL (ref 4.0–10.0)

## 2009-10-18 LAB — BASIC METABOLIC PANEL
Anion Gap: 10 (ref 7–16)
CO2: 27 mmol/L (ref 20–28)
Calcium: 8.8 mg/dL (ref 8.6–10.2)
Chloride: 98 mmol/L (ref 96–108)
Creatinine: 0.7 mg/dL (ref 0.51–0.95)
GFR,Black: >59 *
GFR,Caucasian: >59 *
Glucose: 153 mg/dL — ABNORMAL HIGH (ref 74–106)
Lab: 12 mg/dL (ref 6–20)
Potassium: 4.4 mmol/L (ref 3.3–5.1)
Sodium: 135 mmol/L (ref 133–145)

## 2009-11-02 ENCOUNTER — Encounter: Payer: Self-pay | Admitting: Infectious Diseases

## 2009-11-06 ENCOUNTER — Ambulatory Visit: Payer: Self-pay | Admitting: Internal Medicine

## 2009-11-09 ENCOUNTER — Ambulatory Visit
Admit: 2009-11-09 | Discharge: 2009-11-09 | Disposition: A | Discharge status: Home / Self Care | Payer: Self-pay | Source: Ambulatory Visit | Attending: Pulmonology | Admitting: Pulmonology

## 2009-11-12 NOTE — Progress Notes (Signed)
Reason For Visit   Consult. Per Lynn Ito GYN wants to refer pt to [facility] for prolapse   bladder. Pt is worried if surgery is needed if she is ready with current   health problems. Pt is doing better but not 100% per pt.  HPI   Patient is overall doing well with regard to her lung infection.  Patient   has been treated for pneumonia/Mycobacterium avium intracellular infection.    Patient is having almost normal appetite.  Patient is doing incentive   spirometry.  Patient's breathing is getting better.  Patient is eating   better and her weight is picking up.     The patient went to her gynecologist who told that she has a prolapsed   bladder and may require some specialist intervention.     Patient has a history of hysterectomy.     Patient has had some bladder suspension surgery several years ago.     Patient is not sure if she will be a candidate for further surgery given   her lung problems.  Patient continues to be anxious as usual and these   questions have made her more anxious.     Patient denies chest pain, shortness of breath, orthopnea, PND, swelling of   ankles, palpitations, cough, expectoration or hemoptysis.  No abdominal   pain.  Bowels are normal.  No bright red blood p.r. or melena.  Micturition   normal.  Appetite is good.  Weight is stable.  No weakness, numbness or   claudication.       Patient has a history of anxiety and is on a variety of medications for the   same.     Patient has occasional palpitations and is on beta blocker.     Patient is depressed and is on Remeron which helps her to sleep.     Patient has a history of hypertension and her blood pressure control is   very good pain     patient is married and lives with her husband.  one of her  grandchildren   is getting married in August and patient is quite excited.     medications and allergies are as in the chart.  Patient does not smoke.    Has occasional wine.  No recreational drug use.  Allergies   Bee sting  Cipro TABS;  Headache.  Current Meds   ** Medication reconciliation completed. **.  Premarin 0.625 MG Tablet;TAKE 1 TABLET DAILY.; RPT  Multivitamins Tablet;TAKE 1 TABLET DAILY.; RPT  Calcium 600 + D 600-200 MG-UNIT TABS;TAKE 1 TABLET DAILY.; RPT  Aspirin 81 MG Tablet;TAKE 2 TABLET DAILY; Rx  Anucort-HC 25 MG Suppository;INSERT AS DIRECTED; Rx  Propranolol HCl 20 MG Tablet;TAKE 1 TABLET TWICE DAILY.; Rx  Astepro 0.15 % Solution;1 spray each nostril bid; RPT  Amitriptyline HCl 10 MG Tablet;TAKE ONE TABLET AT BEDTIME; Rx  Miconazole Nitrate 2 % Cream;APPLY SPARINGLY TO AFFECTED AREA(S) TWICE   DAILY; Rx  Furosemide 20 MG Tablet;TAKE 1 TABLET DAILY.; Rx  Mirtazapine 15 MG Tablet;TAKE 1 TABLET AT BEDTIME.; Rx  Hydrocortisone 2.5 % Cream (RE);APPLY ONCE DAILY AS NEEDED.; Rx  Asmanex 60 Metered Doses 220 MCG/INH Aerosol Powder Breath Activated;INHALE   1 PUFFS DAILY; RPT  Cheratussin AC 100-10 MG/5ML Syrup;TAKE 1 TEASPOONFUL EVERY 4 HOURS AS   NEEDED; Rx  LORazepam 0.5 MG Tablet;TAKE 1 TABLET 3 TIMES DAILY AS NEEDED FOR ANXIETY;   Rx  Nitrostat 0.4 MG Tablet Sublingual;PLACE 1 TABLET UNDER THE TONGUE EVERY 5  MINUTES UP TO 3 DOSES AS NEEDED FOR CHEST PAIN.; Rx  Losartan Potassium 50 MG Tablet;TAKE 1 TABLET DAILY.; Rx.  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  Vital Signs   Recorded by dpope on 06 Nov 2009 01:33 PM  BP:158/72,  LUE,  Sitting,   HR: 86 b/min,  L Radial, Regular,   Temp: 98.0 F,   Weight: 139 lb,   O2 Sat: 98 (%SpO2),  RA.  Physical Exam   GENERAL APPEARANCE: Appears stated age, well appearing   anxious.Patient has gained a few pounds in weight.  HEENT: PERRL, EOMI, TMs normal,  oropharynx clear  LUNGS: Clear to auscultation and percussion, patient has an occasional   wheeze.  HEART: Normal S1,S2 without murmurs, gallops, or rubs  ABDOMEN: NABS, soft, non-tender, without hepato-splenomegaly  EXTREMITIES: Without clubbing, cyanosis, or edema  NEUROLOGIC: Alert and oriented x3, no focal deficit.  Assessment   Possible prolapsed urinary bladder: Examination was not done today for   this.  Patient had a lot of questions surrounding any intervention for   this.  Patient may just need a pessary placement or surgical procedure   itself.  Patient's present pulmonary problems should not pose a significant   risk for any surgery.  Patient will get this addressed by the pulmonologist   also.  Patient was reassured.     Pneumonia/MAI infection: Patient is doing much better.  Patient has been   told by the infectious disease specialist Dr. Burnett Harry that she can follow up   with him as needed.  Patient is happy.     The patient sees Dr. Tama High and is going to be followed up by him in the   next day or two.  At that time patient will have this also addressed.     hypertension: The patient's systolic pressure is slightly elevated.    Patient was advised to be less anxious and this will be rechecked.  If she   continues to have fully controlled blood pressure patient may need to be   increasing the dose of losartan 100 mg a day.     .  Plan   Follow-up: As previously planned or earlier if needed.  Signature   Electronically signed by: Gaye Pollack  M.D.; 11/12/2009 11:06 PM   EST.

## 2009-11-21 ENCOUNTER — Ambulatory Visit: Payer: Self-pay | Admitting: Internal Medicine

## 2009-11-26 NOTE — Progress Notes (Signed)
Reason For Visit   Consult.  HPI   Patient was accompanied by her husband.     The patient has possibly a dropped urinary bladder and has urinary   incontinence.  Patient is planning to see urogynecology as but the   appointment is not immediately coming through.  The patient has had her grandson's wedding to attend to and to go for there   and will family ocean trip in [place].  The patient was wondering if   she can afford to wait for any surgery after all these all over.     Overall patient is feeling a bit better.     Patient lost a lot of weight after possibly having atypical mycobacterial   infection.  Patient felt this problem more after she began to lose weight.    Patient has had 3 pregnancies in her life.  Patient has been coughing a lot   which also might have increased the pressure in the abdomen causing more   prolapse.  Patient was wondering about all these reasons why she has this   problem of  bladder drop.     medications and allergies are as in the chart.  Allergies   Bee sting  Cipro TABS; Headache.  Current Meds   ** Medication reconciliation completed. **.  Premarin 0.625 MG Tablet;TAKE 1 TABLET DAILY.; RPT  Multivitamins Tablet;TAKE 1 TABLET DAILY.; RPT  Calcium 600 + D 600-200 MG-UNIT TABS;TAKE 1 TABLET DAILY.; RPT  Aspirin 81 MG Tablet;TAKE 2 TABLET DAILY; Rx  Propranolol HCl 20 MG Tablet;TAKE 1 TABLET TWICE DAILY.; Rx  Amitriptyline HCl 10 MG Tablet;TAKE ONE TABLET AT BEDTIME; Rx  Mirtazapine 15 MG Tablet;TAKE 1 TABLET AT BEDTIME.; Rx  LORazepam 0.5 MG Tablet;TAKE 1 TABLET 3 TIMES DAILY AS NEEDED FOR ANXIETY;   Rx  Nitrostat 0.4 MG Tablet Sublingual;PLACE 1 TABLET UNDER THE TONGUE EVERY 5   MINUTES UP TO 3 DOSES AS NEEDED FOR CHEST PAIN.; Rx  Losartan Potassium 50 MG Tablet;TAKE 1 TABLET DAILY.; Rx.  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  Vital Signs   Recorded by dpope on 21 Nov 2009 12:08 PM  BP:146/72,  LUE,  Sitting,   HR: 72 b/min,  L Radial, Regular,   Weight: 139 lb.  Physical Exam   GENERAL APPEARANCE: Appears stated age, well appearing, NAD  HEENT: PERRL, EOMI, TMs normal, oropharynx clear  LUNGS: patient has harsh vesicular breath sounds  HEART: Normal S1,S2 without murmurs, gallops, or rubs  ABDOMEN: NABS, soft, non-tender, without hepato-splenomegaly  EXTREMITIES: Without clubbing, cyanosis, or edema  NEUROLOGIC: Alert and oriented x3,  Assessment   Bladder prolapse colon patient was reassured that she can afford to wait   until she comes back from medications to have any surgery if at all needed.    For now patient is improving on her cough.  Patient was advised to   increase a good protein H. food.  Patient should do daily exercises.  Plan   Follow up: As previously planned.  Signature   Electronically signed by: Gaye Pollack  M.D.; 11/26/2009 4:59 AM   EST.

## 2009-12-31 ENCOUNTER — Ambulatory Visit: Payer: Self-pay | Admitting: Obstetrics and Gynecology

## 2010-01-01 ENCOUNTER — Ambulatory Visit: Payer: Self-pay | Admitting: Primary Care

## 2010-01-01 LAB — AEROBIC CULTURE: Aerobic Culture: 0

## 2010-01-01 NOTE — Progress Notes (Addendum)
Reason For Visit   Rash x this am lt arm to neck area  ccollings cma.  Allergies   Bee sting  Cipro TABS; Headache.  Current Meds   ** Medication reconciliation completed. **.  Premarin 0.625 MG Tablet;TAKE 1 TABLET DAILY.; RPT  Multivitamins Tablet;TAKE 1 TABLET DAILY.; RPT  Calcium 600 + D 600-200 MG-UNIT TABS;TAKE 1 TABLET DAILY.; RPT  Aspirin 81 MG Tablet;TAKE 2 TABLET DAILY; Rx  Propranolol HCl 20 MG Tablet;TAKE 1 TABLET TWICE DAILY.; Rx  Amitriptyline HCl 10 MG Tablet;TAKE ONE TABLET AT BEDTIME; Rx  Mirtazapine 15 MG Tablet;TAKE 1 TABLET AT BEDTIME.; Rx  LORazepam 0.5 MG Tablet;TAKE 1 TABLET 3 TIMES DAILY AS NEEDED FOR ANXIETY;   Rx  Nitrostat 0.4 MG Tablet Sublingual;PLACE 1 TABLET UNDER THE TONGUE EVERY 5   MINUTES UP TO 3 DOSES AS NEEDED FOR CHEST PAIN.; Rx  Losartan Potassium 50 MG Tablet;TAKE 1 TABLET DAILY.; Rx.  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  Vital Signs   Recorded by [facility] on 31 Dec 2009 11:00 AM  BP:132/80,  RUE,  Sitting,   Height: 64 in, Weight: 64 kg, BMI: 24.2 kg/m2,   Pain Scale: 2.  Recorded by ccollings on 01 Jan 2010 12:58 PM  Weight: 144 lb.  SOAP   Rash   SUBJECTIVE:  76 year old Kristine [name] presenting for evaluation of rash.  Patient   woke this morning with an itchy red rash on the side of her left shoulder.    Since then she has noted lesions on the left arm and on the left side of   the neck.  Arm has a burning itching sensation, including areas where there   is no lesions.  Patient did receive Zostavax.  She had a case of zoster in   the past.  OBJECTIVE: Groups of papules on erythematous base along left C6   distribution.    ASSESSMENT:   herpes zoster  PLAN:   Reviewed diagnosis with patient.  Valtrex 1 g 3 times a day for 7   days.  Prednisone 10 mg, 3 pill twice a day for one week, 1-1/2 pill twice   a day for one week then one pill twice daily for one week.    Over-the-counter analgesics as needed.  Followup if inadequate pain relief   or other symptoms worsen. AmendedFabio Bering ; 01/01/2010 1:37 PM EST.  Signature   Electronically signed by: Fabio Bering  M.D.; 01/01/2010 1:37 PM EST.  Electronically signed by: Fabio Bering  M.D.; 01/01/2010 1:37 PM EST.

## 2010-01-02 DIAGNOSIS — B029 Zoster without complications: Secondary | ICD-10-CM | POA: Insufficient documentation

## 2010-01-07 NOTE — Progress Notes (Addendum)
Reason For Visit   Urinary incontinence and pelvic organ prolapse.  HPI   Consulting Physician/OBGYN: Dr. Lynn Ito  Primary Care Physician: Dr. Tish Men     The patient is an 76 year old G3 P3 postmenopausal woman with complaints of   urinary incontinence and pelvic organ prolapse.  She has been experiencing predominantly urge related urinary incontinence   and some stress-related urinary incontinence.  Most of her leakage episodes   are related to urinary urgency which occurs mostly at night.  She wakes up   1-2 times at night with urgency to urinate.  She reports some urinary   incontinence during the daytime as well.  Additionally, she reports urinary   leakage with minimal provocation such as standing up from sitting position   and twisting.  She uses thin overnight pads which are typically damp in the   morning.  She goes to bed at 10 PM and wakes up at 6 AM.  She has not tried   any treatments in the past.  She denies symptoms of recurrent bladder   infections or inability to empty her bladder.  She has no pain with   urination.  The patient reports symptoms of pelvic organ prolapse starting within the   last year.  She is usually not aware of the bulge as it does not protrude   out of the vagina.  She reports vaginal bleeding which may be due to   hemorrhoids as well.  She has tried using a pessary in the past but was   unable to tolerate its use.  The patient denies any leakage of stool.  She has a bowel movement every   other day.  Voiding Diary   The entries were incorrect.  Most of her leakage episode were  Limited to a   few drops. Activities preceding her leaks included sitting, watching TV and   napping.  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  PSH   Enterocele Repair, Vag. Approach (Sep Procedure)  Vaginal Hysterectomy.  Allergies   Bee sting  Cipro TABS; Headache.  Current Meds   Premarin 0.625 MG Tablet;TAKE 1 TABLET DAILY.; RPT  Multivitamins Tablet;TAKE 1 TABLET DAILY.; RPT  Calcium 600 + D 600-200 MG-UNIT TABS;TAKE 1 TABLET DAILY.; RPT  Aspirin 81 MG Tablet;TAKE 2 TABLET DAILY; Rx  Propranolol HCl 20 MG Tablet;TAKE 1 TABLET TWICE DAILY.; Rx  Amitriptyline HCl 10 MG Tablet;TAKE ONE TABLET AT BEDTIME; Rx  Mirtazapine 15 MG Tablet;TAKE 1 TABLET AT BEDTIME.; Rx  LORazepam 0.5 MG Tablet;TAKE 1 TABLET 3 TIMES DAILY AS NEEDED FOR ANXIETY;   Rx  Nitrostat 0.4 MG Tablet Sublingual;PLACE 1 TABLET UNDER THE TONGUE EVERY 5   MINUTES UP TO 3 DOSES AS NEEDED FOR CHEST PAIN.; Rx  Losartan Potassium 50 MG Tablet;TAKE 1 TABLET DAILY.; Rx  Clotrimazole 10 MG [place];; RPT.  Personal Hx   No Alcohol Use  No Drug Use  Marital History  No Physically Abused  No Sexually Abused  No Sexually Active  No Smoking.  Family Hx   Family history of Breast Cancer.  ROS   General: No fatigue.  No weight gain.  Complains of weight loss.  No night  sweats.  No fevers.  HEENT:  No vision change. No hearing loss. No problems with sense of smell.   No difficulty swallowing.    Cardiovascular her: No chest pain.No palpitations.  No  syncope.    Respiratory: No shortness of breath. No chronic cough.  No wheezing.  Gastriointestinal: No nausea. No vomiting. No chronic diarrhea.  No   constipation. No bloating.  Complains of abdominal pain. No indigestion. No   blood in stool color or change in stool.   Genitourinary: As per HPI.  Musculoskeletal  system: No muscle pain. No joint pain. No muscle weakness.   No leg swelling.  Dermatology: No skin rashes. No skin lesions.  No easy bruising. No   bleeding.  Neurological system: No seizures. No numbness of extremities. No weakness   of extremities. No  headaches.    Psychiatry: No depression.  Complains of anxiety.    Endocrine: No excessive thirst. No feeling too hot. No feeling too cold.   Hematologic:  No allergies. No lymphadenopathy.  No immune diseases.  .  Vital Signs   Recorded by [facility] on 31 Dec 2009 11:00 AM  BP:132/80,  RUE,  Sitting,   Height: 64 in, Weight: 64 kg, BMI: 24.2 kg/m2,   Pain Scale: 2.  Physical Exam   General: No apparent distress. Alert and oriented x 3. Healthy-appearing   woman who appears her stated age.  Well-groomed.  Neatly dressed.   CVS: S1-S2, regular.  No murmurs, rubs, or gallops.  Respiratory: Clear to Auscultation Bilaterally.  No Wheezes or Crackles.  Abdomen: Soft.  Nontender.  Nondistended.  Normal bowel sounds present.  No   masses.  No hernias.  No hepatosplenomegaly.   Vulva: Normal external genitalia.  Symmetric labia.  No skin lesions or   pigmentation changes. Normal Bartholin's and Skene's glands.  Normal   external urethral meatus.   Vagina: No epithelial lesions or pigmentation changes.  No tenderness with   palpation underneath the bladder or urethra.  No release of urine or   discharge with palpation underneath the urethra.  No tenderness with   palpation over the pelvic floor muscles.   Cystocele: Stage II midline cystocele  Rectocele: None  Apical Descensus: None  Enterocele: None  POP-Q Measurements: Aa = 0. Ba =0. Ap = - 3.  Bp = -3.  C = 6.  D =na.  TV   L = 8.    Uterus: Surgically absent  Adenexa: No masses.  Nontender.  Cough Stress Test: Negative  Urethral Mobility: Present on visual assessment  Void: 50 cc  Postvoid Residual Volume: Less than 5 cc by catheter. The decision was made   to proceed with straight catheterization due to patient's complaint of   pelvic organ prolapse.  The urethra was prepped in sterile fashion and   catheterization was performed using an 8-French catheter.  The catheter was   passed without difficulty and the patient tolerated the procedure well.     Urine dip  stick: SG 1.010, pH 6, leukocytes negative, nitrites negative,   protein negative, glucose negative, ketones negative, urobilinogen < 1,   bilirubin negative, blood negative.      24-hr pad weight: 52 g     .  Assessment   1.  Post hysterectomy vaginal vault prolapse stage II: Midline cystocele   stage II does not protrude beyond hymen.    2.  Mixed urinary incontinence with components of urge, activity related   and unprovoked urinary incontinence.  3.  No urinary retention.  4.  No recurrent urinary tract infections.  5.  No fecal incontinence.  Plan   Post hysterectomy vaginal vault prolapse stage II (at the level of the   hymen): We discussed the findings of the exam.  She was noted to have a   midline cystocele which does not protrude beyond the hymen.  We reviewed   treatment options including a trial of a different pessary or surgical   repair.  As the patient is currently asymptomatic, we will proceed with   expectant management and reevaluate if there is any change in her symptoms.      Urinary incontinence: Based on the patient's history and our assessment of   her voiding diary it is difficult to assess the exact nature of her urinary   incontinence.  There appear to be elements of both urge and stress related   urinary incontinence.  She also reports insensible urinary leakage.  She   reports predominant symptoms of urge urinary incontinence at night however   this was not evident by the  bladder diary.  We recommend urodynamic   testing further assess her incontinence symptoms which cannot be clearly   diagnosed based on clinical report alone.  Today, we discussed the   importance of simple measures such as normalization of fluid intake, timed   interval voiding and avoiding bladder irritants like caffeine and   carbonated drinks.  We recommend that she complete another bladder diary   for the next visit.     Followup: For urodynamic testing  All her questions were answered and she agreed with the  current management.  Dr. Pershing Proud was  present throughout the evaluation and discussion of the   assessment and plan with the patient.     Attestation: I have seen and evaluated the patient and was present for the   entire visit.  I agree with the Fellow's documented evaluation and plan.    76 year old postmenopausal woman with reports of urinary incontinence-urge   related, stress related, and insensible/unprovoked.  Asymptomatic stage II   cystocele-expectant management for now.  Started on behavioral management   for her incontinence symptoms.  Return for urodynamic testing.  .  Signature   Electronically signed by: Westley Chandler  MD Fellow; 12/31/2009 3:29 PM   EST; Co-author.  Electronically signed by: Eulis Foster ; 01/07/2010 2:40 PM EST.

## 2010-01-08 ENCOUNTER — Ambulatory Visit: Payer: Self-pay | Admitting: Obstetrics and Gynecology

## 2010-01-22 ENCOUNTER — Ambulatory Visit: Payer: Self-pay | Admitting: Obstetrics and Gynecology

## 2010-02-15 ENCOUNTER — Encounter: Payer: Self-pay | Admitting: Obstetrics and Gynecology

## 2010-02-19 NOTE — Progress Notes (Addendum)
Reason For Visit   1 month f/u. CXR results.  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     Patient presented with chronic cough and x-ray changes off right middle and   lower lobe pneumonia.  Patient was treated with p.o. antibiotics.  Since   she was not improving patient had a CAT scan that showed possible pneumonia   process only.     Patient then underwent a variety of investigations including a fiber-optic   bronchoscopy and bronchoalveolar lavage.     patient was seen by Dr. Tama High the pulmonologist in [facility]   according for all these outpatient investigations.  Since patient was not   doing well she got admitted to the hospital.  During that time patient had   a lot of investigations done again.  In the  patient had a significant pleural effusion/possible empyema on the right   side requiring drainage with a chest tube.  During that hospitalization the   patient had been put on antituberculosis treatment because there was an   occasion when there was possible acid-fast bacillus.  Confirmatory tests   came negative no she had Mycobacterium avium intracellularly.     Patient was not seen by the pulmonologist during that hospital visit but   seen by Dr. Vance Gather who is specialist in infectious diseases.  He put the   patient on anti-tuberculosis treatment initially though it was redrawn   before she got off the hospital.  Patient had been put on rifampicin twice   a day as she is on still.     Two days ago patient went to see both Dr. Tresa Endo and Dr. Vance Gather.     Dr. Tresa Endo was not supporting a diagnosis of tuberculosis right from the   start.  Patient was told by him that she may come off rifampicin after the   first month his old but that decision had to come from Dr. Burnett Harry.     The same day patient saw the nurse practitioner of Dr. Burnett Harry who did not   stop this medication.  Patient is extremely confused right from confusing   day names.  Patient is extremely anxious and upset that  did not really   coming up with clearcut answers.  In the  patient has felt her husband are quite depressed with the diagnosis itself.    They used to be up and about and in very much involved in community   activities.  He had been in a physical and social isolation mode and they   don't approve that.     Patient has been put on antidepressant medication edges are supposed to   help her mood as well appetite.  Pain the patient has not been eating well.  Patient is upset that she was   told by the pulmonologist and that if she does not catch up and eat better   she may die.  Length  patient has a history of anxiety/hypertension and is on propranolol.     Patient continues to have problems with chronic cough and is using a lot of   medications.  Recently the patient was started on Asmanex  the   pulmonologist to see if it would help her reduce the bronchial   hyperreactivity.     medications and allergies are as in the chart.     Patient has a chronic constant cough but this is slightly better than   before.  Patient remains quite  anxious.     Patient cannot sleep well partly because of the cough and partly because of   the depression.     medications and allergies are as in the chart.     Patient denies any chest pain.  Patient has a cough but does not produce   much phlegm.  No major weakness or numbness.  Patient has presented with recurrent symptoms of cough and this has been   going on for almost 3 months.  Patient had chest x-rays done they showed   right middle lobe consolidation.  Patient subsequently had a CAT scan of   the chest which confirmed this.  There was a possible solution of atypical   mycobacteriosis.     Patient improved a lot and required at least 3 courses of antibiotics.     Patient's cough continued and therefore was evaluated by Dr Tama High.  He   sees a pulmonologist in-hospital.  Patient underwent fiber-optic   bronchoscopy and all the cultures are negative.  Patient was treated with    mucinex.     patient continued to have symptoms and was admitted to [facility].    Patient had x-ray changes of empyema requiring a pigtail catheterization by   the radiologist.  Patient was having one sputum seems smear positive for   acid-fast bacillus.  The culture results become available now and it was   Mycobacterium avium intracellular complex.  Patient was in isolation and   was started on 4 drug antituberculosis regimen initiated.  Because of entry   test from and 90 did not show Mycobacterium tuberculosis the typical kind.    Patient therefore was taken off the isolation and sent back home.     During the hospitalization patient was predominantly seen by the internal   medicine team and Dr Burnett Harry, an infectious disease specialist.     Patient especially the husband is in severe denial to accept the diagnosis   of tuberculosis.  Patient was advised to have weekly chest x-rays and she   has been faithfully doing it for the past 3 weeks.  Patient continues to   have some right basilar changes but overall much better.     The patient's appetite is very much wt and patient is quite depressed.    Patient has lost almost 20 pounds in weight.     Patient here to have been very active in the community and cannot accept   this.     patient does not smoke.  Patient has a history of alcohol use in the   distant past but not currently.     The patient has a very supportive family and over caring husband.     The patient went and saw the pulmonologist who thought weekly chest x-rays   are not warranted.  He thinks the MA I is incidental and would not worry   too much about it.     Patient was seen by the infectious disease specialist who has put her on   rifampicin 300 mg one capsule twice a day.  Patient cannot swallow capsules   and wanted something done about it.     Patient has been started on small doses of lisinopril for slight elevated   hypotension in the hospital.  Patient's blood pressure was  adequate.  The patient remained quite anxious and is on Ativan.  Patient wanted a prescription for some antidepressant medication.     Patient also is getting given  on the Robitussin-AC cough syrup.     Medication allergies all as in the chart.     no fever or chills.  No chest pain.  Patient's cough is getting better.    Patient has no focal weakness or numbness no feeling extremely tired and   rundown. AmendedGaye [name] ; 11/06/2009 2:03 PM EST.  Allergies   Bee sting  Cipro TABS; Headache.  Current Meds   ** Medication reconciliation completed. **.  Premarin 0.625 MG Tablet;TAKE 1 TABLET DAILY.; RPT  Multivitamins Tablet;TAKE 1 TABLET DAILY.; RPT  Calcium 600 + D 600-200 MG-UNIT TABS;TAKE 1 TABLET DAILY.; RPT  Aspirin 81 MG Tablet;TAKE 2 TABLET DAILY; Rx  Anucort-HC 25 MG Suppository;INSERT AS DIRECTED; Rx  Propranolol HCl 20 MG Tablet;TAKE 1 TABLET TWICE DAILY.; Rx  Astepro 0.15 % Solution;1 spray each nostril bid; RPT  Amitriptyline HCl 10 MG Tablet;TAKE ONE TABLET AT BEDTIME; Rx  Miconazole Nitrate 2 % Cream;APPLY SPARINGLY TO AFFECTED AREA(S) TWICE   DAILY; Rx  Furosemide 20 MG Tablet;TAKE 1 TABLET DAILY.; Rx  Mirtazapine 15 MG Tablet;TAKE 1 TABLET AT BEDTIME.; Rx  Rifadin 300 MG Capsule;TAKE 1 CAPSULE TWICE DAILY; RPT  Hydrocortisone 2.5 % Cream (RE);APPLY ONCE DAILY AS NEEDED.; Rx  LORazepam 0.5 MG Tablet;TAKE 1 TABLET 3 TIMES DAILY AS NEEDED FOR ANXIETY;   Rx  Cheratussin AC 100-10 MG/5ML Syrup;TAKE 1 TEASPOONFUL EVERY 4 HOURS AS   NEEDED; Rx  Physical Therapy;evaluate and treat; Rx  Nitrostat 0.4 MG Tablet Sublingual;PLACE 1 TABLET UNDER THE TONGUE EVERY 5   MINUTES UP TO 3 DOSES AS NEEDED FOR CHEST PAIN.; Rx  Asmanex 60 Metered Doses 220 MCG/INH Aerosol Powder Breath Activated;INHALE   1 PUFFS DAILY; RPT.  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  Vital Signs   Recorded by [facility] on 12 Oct 2009 10:27 AM  BP:150/72,  LUE,  Sitting,   HR: 94 b/min,  L Radial, Regular,   Temp: 98.8 F,   Weight: 138 lb,   O2 Sat: 97 (%SpO2),  RA.  Physical Exam   GENERAL APPEARANCE: Appears stated age   very anxious and worried-looking.  Patient has a mild cough  HEENT: PERRL, EOMI, TMs normal, oropharynx clear  LUNGS: Clear to auscultation and percussion,and she is surprisingly good on   both bases.  HEART: Normal S1,S2 without murmurs, gallops, or rubs  ABDOMEN: NABS, soft, non-tender, without hepato-splenomegaly  EXTREMITIES: Without clubbing, cyanosis, or edema  NEUROLOGIC: Alert and oriented x3,  GENERAL APPEARANCE: Normal habitus. Well developed, well groomed. Appears   stated age. No acute distress. Color good.  MENTAL STATUS: Appears alert and oriented. Affect appropriate.  SKIN: Skin color and turgor normal. No suspicious lesions, masses, rashes,   or ulcerations. Nails and hair appear normal.  HEAD: Normocephalic.  EARS: External ear w/o scars, masses, or lesions. External auditory canal   intact, clear, and w/o lesions. TMs intact with normal light reflex and   landmarks. Acuity to conversational tones good.  EYES: PERRLA, extraocular movements intact. Lids w/o defect, conjunctiva   and sclera appear normal. Fundi w/o papilledema, hemorrhage, exudates, or   arterial abnormalities.  NOSE: Nasal mucosa and turbinates pink, septum midline, no lesions.  MOUTH: Teeth in good repair. Gums pink w/o lesions. Normal appearing   mucosa, palate, and tongue.   OROPHARYNX: Moist w/o exudate, erythema, or swelling.  NECK: Symmetric, trachea midline. Full ROM w/o pain or tenderness. Thyroid   nontender w/o enlargement or masses. Carotid pulses normal with no bruits.   No cervical  lymphadenopathy.  BREASTS: Breasts appear normal and symmetric w/o palpable masses, skin   changes, or nipple inversion. No discharge, rash, or skin retraction by   inspection. No axillary or supraclavicular lymphadenopathy.  CHEST:patient has marked reduction of the breath sound intensity in the at   night to lower lobe and middle lobe region.  Patient has dullness.r   bilaterally w/o wheezes, rubs, rales, or rhonchi.  CV: Normal precordium and PMI w/o lifts, heaves, or thrills. Normal S1 and   S2 w/o murmur, rub, gallop, or click. Capillary refill within 2 seconds. No   edema, clubbing, or cyanosis. No varicosities. Radial, femoral, dorsalis   pedis, and posterior tibial pulses full and symmetrical.  GI/ABDOMEN: Abdomen soft with normal bowel sounds. No guarding or rebound.   No palpable masses or tenderness. Liver and spleen are w/o tenderness or   enlargement. No aortic widening. No inguinal adenopathy.  GU: not done   RECTAL:not done   MS: Muscle tone and strength normal for age, w/o atrophy or abnormal   movement.  EXTREMITIES: Joints w/full ROM, w/o tenderness, crepitus, or contracture.   No obvious joint deformities or effusions.  NEUROLOGICAL: Cranial nerves II-XII intact. Motor strength symmetrical with   no obvious weaknesses. Superficial sensation intact bilaterally to light   touch and pain. Observed dexterity w/o ataxia or tremor. Deep tendon   reflexes full and symmetric bilaterally. Gait coordinated and smooth.   AmendedGaye [name] ; 11/06/2009 2:03 PM EST.  Assessment   Chronic cough: Resolving pneumonia with bronchial hyperreactivity: Patient   is on the inhaler.  Patient continues on all the supportive care including   cough medications.  Patient should drink a lot of fluids.  Patient may have   to check with the infectious disease specialist to see if she can come off   the rifampicin.  Since they started it did not want to stop it abruptly by   me.     Hypertension: Patient's blood  pressure control is adequate.  Continue same     anxiety: Patient was given a lot of information on coping techniques.  At   least 35 minutes was spent with the patient going over all the results and   the recommendations from the pulmonologist.  I do not have the   recommendations of the infectious disease specialist as yet.  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  Patient   was given a lot of information on different aspects of tuberculosis and   written  information provided with handouts.  Patient was very anxious as   usual.  All their questions were invited and answered today for   satisfaction.patient has been put on rifampicin the capsule of which can be   opened and mixed in orange juice and can be taken that way.patient does not   require chest x-rays on a weekly basis and is not going to change that much   so quickly.  Patient will have the chest x-ray repeated in 3 weeks.    Patient has follow-up appointments with her specialists.  Patient was   warned that the rifampicin can cause red urine and red sweats.  anxiety: Patient should continue on the Ativan.  The patient was about the   addiction potential of this medication.     depression: Patient will be started on Remeron 15 mg a day which should   help with her sleep, anxiety and improve the appetite and therefore the   week.  Information on the side effects of this medication including   excessive sleepiness and sweating provided.  Patient is not interested in   going for any counseling.     wt loss :hopefully it will medication should help.  It is for improvement   patient may require medications like Megace.  Patient was advised to use a   high calorie nutritional supplementation.     hypertension: Patient blood pressure is excellent.  Continue the same   patient does not feel that the lisinopril is causing any significant cough.   AmendedGaye [name] ; 11/06/2009 2:03 PM EST.  Plan   Follow-up: As  previously planned or earlier if needed.  Follow-up: In one month or earlier if needed. AmendedGaye [name] ; 11/06/2009 2:03 PM EST.  Signature   Electronically signed by: Kristine [name]  M.D.; 10/14/2009 4:14 PM   EST.  Electronically signed by: Kristine [name]  M.D.; 02/19/2010 8:23 AM   EST.

## 2010-02-21 ENCOUNTER — Ambulatory Visit: Payer: Self-pay | Admitting: Internal Medicine

## 2010-03-02 NOTE — Progress Notes (Signed)
Reason For Visit   Red itchy spots right buttcok/upper thigh area.  HPI   Pt is  here for a FUP and   for  some rash      not sure if she was bitten by small spiders in her,"old " house      Her cough is much better      had been  travelling  a lot in the summer       Did their traditional  ocean fishing trip      Had  a grand daughter getting married  to the [place] groom   The other grand daughter  is in [place] --met Lollie Sails from [place]  there --both   of them  also came  and visited  the wedding area        Patient has been recently diagnosed to have mycobacterium avium   intracellular infection.  no problems      appetite is good      wt is up  again and has atleast gained  half of her lost wt      has allergies  to golden rods      Patient does not have any fever or chills.  Patient's appetite is still   down despite eating better.  Patient's weight has gone down by another 3   pounds.     Patient along with her husband have a large collection of bird nests  which   are located near their air conditioner vent.  The in the process of   relocating them though that has not happened as yet.     patient remains quite anxious as usual.      Patient sees both the pulmonologist and the infectious diseases specialist.       Patient is also on a small dose of propanolol for anxiety related   palpitations.     Patient continues to have problems with her sleep because of the cough and   anxiety.     Medications allergies were asked in the chart.     Patient seems to be quite depressed from all that is happening around her   period     denies any chest pain.  Patient has a mild shortness of breath.  No major   weakness or numbness     medications and allergies are as in the chart.     Patient lives with her husband.  Allergies   Bee sting  Cipro TABS; Headache.  Current Meds   ** Medication reconciliation completed. **.  Premarin 0.625 MG Tablet;TAKE 1 TABLET DAILY.; RPT  Multivitamins Tablet;TAKE 1 TABLET DAILY.; RPT  Calcium  600 + D 600-200 MG-UNIT TABS;TAKE 1 TABLET DAILY.; RPT  Aspirin 81 MG Tablet;TAKE 2 TABLET DAILY; Rx  Mirtazapine 15 MG Tablet;TAKE 1 TABLET AT BEDTIME.; Rx  LORazepam 0.5 MG Tablet;TAKE 1 TABLET 3 TIMES DAILY AS NEEDED FOR ANXIETY;   Rx  Nitrostat 0.4 MG Tablet Sublingual;PLACE 1 TABLET UNDER THE TONGUE EVERY 5   MINUTES UP TO 3 DOSES AS NEEDED FOR CHEST PAIN.; Rx  Losartan Potassium 50 MG Tablet;TAKE 1 TABLET DAILY.; Rx  Propranolol HCl 20 MG Tablet;TAKE 1 TABLET TWICE DAILY.; Rx  Amitriptyline HCl 10 MG Tablet;TAKE ONE TABLET AT BEDTIME; Rx  Chlor-Trimeton 4 MG Tablet;TAKE 1 TABLET EVERY 4 TO 6 HOURS AS NEEDED.; RPT.  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  Vital Signs   Recorded by dpope on 21 Feb 2010 09:56 AM  BP:136/58,  LUE,  Sitting,   HR: 64 b/min,  R Radial, Regular,   Temp: 98.2 F,   Height: 65 in, Weight: 147 lb, BMI: 24.5 kg/m2.  Physical Exam   GENERAL APPEARANCE: Appears stated age, well appearing   patient is thin built now.        HEENT: PERRL, EOMI, TMs normal, oropharynx clear  LUNGS:patient has mild reduced intensity of her it sounds in the right   lower base but this is much better than previously.  HEART: Normal S1,S2 without murmurs, gallops, or rubs  ABDOMEN: NABS, soft, non-tender, without hepato-splenomegaly.rectal exam   showed perianal excoriation which may be causing the redness on the toilet   tissue as well a pink stool which was guaiac-negative.  EXTREMITIES: Without clubbing, cyanosis, or edema  NEUROLOGIC: Alert and oriented x3, cranial nerves II-XII intact,   motor/sensory exam normal, DTRs symmetric, normal  gait.  Assessment   Possible insect  bite allergy: Use Clobetasol  cream, ct antihisatamines as   below      seasonal allergies : Ct  avoiding any sp allergens, Can Ct on the   antihistamines      H/O Mycobacterium avium intracellular infection: Patient is otherwise a   healthy host.  Patient  was  on rifampicin briefly.  pt is doing well   without it      anxiety: Patient shall continue with the anxiolytics.  Patient is on   propranolol this helps to take care of her palpitations.     Weight loss: is better now      Reactive depression: Patient has insomnia from this.  Patient is on   Mirtazepine - hopefully should help with her sleep as well increasing her   appetite.  Plan   Follow-up: As previously planned or earlier if required.  Patient was given   all the information in writing as usual.  Signature   Electronically signed by: Gaye Pollack  M.D.; 03/02/2010 5:06 PM   EST.

## 2010-03-18 ENCOUNTER — Ambulatory Visit: Payer: Self-pay

## 2010-04-18 ENCOUNTER — Ambulatory Visit
Admit: 2010-04-18 | Discharge: 2010-04-18 | Disposition: A | Discharge status: Home / Self Care | Payer: Self-pay | Source: Ambulatory Visit | Attending: Obstetrics | Admitting: Obstetrics

## 2010-04-18 ENCOUNTER — Ambulatory Visit: Payer: Self-pay | Admitting: Internal Medicine

## 2010-04-23 LAB — GYN CYTOLOGY

## 2010-04-30 ENCOUNTER — Ambulatory Visit: Payer: Self-pay | Admitting: Internal Medicine

## 2010-05-05 NOTE — Progress Notes (Signed)
 Reason For Visit   Post nasal drng and hoarseness >1 month. Pt stated stopped Chlor-Trimeton   because of headaches.  dpopelpn.  HPI   Is here  for  a FUP     has  cerumen  impaction   in  both ears      Has  hoarse  voice   has  post  nasal  drip   has been  using  anti histamines      No fever  or chills     Has  less  energy      Patient was  diagnosed to have mycobacterium avium intracellular infection.     and was treated  briefly   Used to see dr Tama High and Dr Burnett Harry  in [facility] '     Patient does not have any fever or chills.  Patient's appetite is still   down despite eating better.      had  lost 20 lbs and gained  10 lbs back and wants  to keep her wt at this      Patient along with her husband have a large collection of bird nests  which   are located near their air conditioner vent.     Have  bat proofed  the house now      patient remains quite anxious as usual.      The patient has been recently started on a smaller dose of fosinopril but   claims that she is not having any major side effects like cough from this.    Patient is also on a small dose of propanolol for anxiety related   palpitations.     Patient continues to have problems with her sleep because of the cough and   anxiety.     Medications allergies were asked in the chart.     had  gone to dr Lynn Ito      denies any chest pain.  Patient has a mild shortness of breath.  No major   weakness or numbness     medications and allergies are as in the chart.     Patient lives with her husband.  He usually comes in for all patient's   appointments but today he did not come with her.  Patient feels that she is   exhausting him.  Allergies   Bee sting  Cipro TABS; Headache.  Current Meds   ** Medication reconciliation completed. **.  Premarin 0.625 MG Tablet;TAKE 1 TABLET DAILY.; RPT  Multivitamins Tablet;TAKE 1 TABLET DAILY.; RPT  Calcium 600 + D 600-200 MG-UNIT TABS;TAKE 1 TABLET DAILY.; RPT  Aspirin 81 MG Tablet;TAKE 2 TABLET DAILY; Rx  Mirtazapine 15  MG Tablet;TAKE 1 TABLET AT BEDTIME.; Rx  LORazepam 0.5 MG Tablet;TAKE 1 TABLET 3 TIMES DAILY AS NEEDED FOR ANXIETY;   Rx  Nitrostat 0.4 MG Tablet Sublingual;PLACE 1 TABLET UNDER THE TONGUE EVERY 5   MINUTES UP TO 3 DOSES AS NEEDED FOR CHEST PAIN.; Rx  Propranolol HCl 20 MG Tablet;TAKE 1 TABLET TWICE DAILY.; Rx  Amitriptyline HCl 10 MG Tablet;TAKE ONE TABLET AT BEDTIME; Rx  Chlor-Trimeton 4 MG Tablet;TAKE 1 TABLET EVERY 4 TO 6 HOURS AS NEEDED.; RPT  Clobetasol Propionate 0.05 % Cream;apply qhs to affected area; Rx  Losartan Potassium 50 MG Tablet;TAKE 1 TABLET DAILY.; Rx.  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  Vital Signs   Recorded by dpope on 30 Apr 2010 11:18 AM  BP:132/64,  LUE,  Sitting,   HR: 60 b/min,  R Radial, Regular,   Temp: 98.0 F,   Height: 65 in, Weight: 148 lb, BMI: 24.6 kg/m2.  Physical Exam   GENERAL APPEARANCE: Appears stated age, well appearing   patient is thin built now.   HEENT: PERRL, EOMI, TMs could not be visualized because of severe cerumen   impaction in both ears. oropharynx clear  LUNGS:patient has mild reduced intensity of her it sounds in the right   lower base but this is much better than previously.  HEART: Normal S1,S2 without murmurs, gallops, or rubs  ABDOMEN: NABS, soft, non-tender, without hepato-splenomegaly.r  EXTREMITIES: Without clubbing, cyanosis, or edema  NEUROLOGIC: Alert and oriented x3, no focal deficit.  Assessment   Cerumen  impction in both ears:The ears were flushed it appeared mechanical   curettage was required.  Patient had instant improvement in her hearing.    Patient wears hearing aides in  both sides.  Examination of the tympanic   membrane was normal.     anxiety: Patient shall continue with the anxiolytics.  Patient is on   propranolol this helps to take care of her palpitations.     HYPERTENSION  --According to JNC 7 guidelines target BP: less than 130/80 patient   currently is above goal; discussed goal with patient  Plan to reach goal includes:  --Lifestyle Modification; discussed DASH eating plan; discussed dietary   sodium reduction; discussed aerobic physical activity  ; educational   materials provided      --Following our conversation the patient is willing to make necessary   changes YES       --Self-management tool provided YES   --Medication Management: no changes made  ----Referral for Care Management:  no        All rhinitis :patient was advised to continue using steroid nose spray and   antihistamines.     Chronic cough cold is much better now.  Patient is on an ARB instead of an   ACI.  Patient fortunately does not have the treatment for Mycobacterium   anymore.  Patient is happy with her these things are happening.  They have   re located the bird houses  There have also bat proofedtheir house.  Plan   Follow-up: As previously planned or earlier if required.  Patient was given   all the information in writing as usual.  Signature   Electronically signed by: Gaye Pollack  M.D.; 05/05/2010 8:48 PM   EST.

## 2010-05-08 ENCOUNTER — Ambulatory Visit
Admit: 2010-05-08 | Discharge: 2010-05-08 | Disposition: A | Discharge status: Home / Self Care | Payer: Self-pay | Source: Ambulatory Visit | Attending: Internal Medicine | Admitting: Internal Medicine

## 2010-05-08 LAB — CBC AND DIFFERENTIAL
Baso # K/uL: 0 THOU/uL (ref 0.0–0.1)
Basophil %: 0.5 % (ref 0.1–1.2)
Eos # K/uL: 0.3 THOU/uL (ref 0.0–0.4)
Eosinophil %: 4 % (ref 0.7–5.8)
Hematocrit: 34 % (ref 34–45)
Hemoglobin: 11.3 g/dL (ref 11.2–15.7)
Lymph # K/uL: 3.3 THOU/uL (ref 1.2–3.7)
Lymphocyte %: 44.4 % (ref 19.3–51.7)
MCV: 96 fL — ABNORMAL HIGH (ref 79–95)
Mono # K/uL: 0.7 THOU/uL (ref 0.2–0.9)
Monocyte %: 9.1 % (ref 4.7–12.5)
Neut # K/uL: 3.1 THOU/uL (ref 1.6–6.1)
Platelets: 251 THOU/uL (ref 160–370)
RBC: 3.6 MIL/uL — ABNORMAL LOW (ref 3.9–5.2)
RDW: 12.7 % (ref 11.7–14.4)
Seg Neut %: 42 % (ref 34.0–71.1)
WBC: 7.5 THOU/uL (ref 4.0–10.0)

## 2010-05-08 LAB — COMPREHENSIVE METABOLIC PANEL
ALT: 14 U/L (ref 0–35)
AST: 18 U/L (ref 0–35)
Albumin: 3.9 g/dL (ref 3.5–5.2)
Alk Phos: 47 U/L (ref 35–105)
Anion Gap: 9 (ref 7–16)
Bilirubin,Total: 0.3 mg/dL (ref 0.0–1.2)
CO2: 26 mmol/L (ref 20–28)
Calcium: 8.7 mg/dL (ref 8.6–10.2)
Chloride: 102 mmol/L (ref 96–108)
Creatinine: 0.85 mg/dL (ref 0.51–0.95)
GFR,Black: >59 *
GFR,Caucasian: >59 *
Glucose: 86 mg/dL (ref 74–106)
Lab: 22 mg/dL — ABNORMAL HIGH (ref 6–20)
Potassium: 4.6 mmol/L (ref 3.3–5.1)
Sodium: 137 mmol/L (ref 133–145)
Total Protein: 6.5 g/dL (ref 6.3–7.7)

## 2010-05-08 LAB — LIPID PANEL
Chol/HDL Ratio: 3.1
Cholesterol: 206 mg/dL — AB
HDL: 67 mg/dL
LDL Calculated: 106 mg/dL
Non HDL Cholesterol: 139 mg/dL
Triglycerides: 163 mg/dL — AB

## 2010-05-13 ENCOUNTER — Ambulatory Visit: Payer: Self-pay | Admitting: Internal Medicine

## 2010-05-19 NOTE — Progress Notes (Signed)
 Reason For Visit   ST, hoarseness and persistent cough expectoracting brown sputum 3 days  dpopelpn.  HPI   Sorethroat      has  a cough  with  green and brown sputum     no hemoptysis  No  fever or chills      had  avery  bad  chest  infn  last year-- Mycobacterium avium --     Husband  also  is slightly  sick.  Allergies   Bee sting  Cipro TABS; Headache.  Current Meds   ** Medication reconciliation completed. **.  Premarin 0.625 MG Tablet;TAKE 1 TABLET DAILY.; RPT  Multivitamins Tablet;TAKE 1 TABLET DAILY.; RPT  Calcium 600 + D 600-200 MG-UNIT TABS;TAKE 1 TABLET DAILY.; RPT  Aspirin 81 MG Tablet;TAKE 2 TABLET DAILY; Rx  Mirtazapine 15 MG Tablet;TAKE 1 TABLET AT BEDTIME.; Rx  LORazepam 0.5 MG Tablet;TAKE 1 TABLET 3 TIMES DAILY AS NEEDED FOR ANXIETY;   Rx  Nitrostat 0.4 MG Tablet Sublingual;PLACE 1 TABLET UNDER THE TONGUE EVERY 5   MINUTES UP TO 3 DOSES AS NEEDED FOR CHEST PAIN.; Rx  Propranolol HCl 20 MG Tablet;TAKE 1 TABLET TWICE DAILY.; Rx  Amitriptyline HCl 10 MG Tablet;TAKE ONE TABLET AT BEDTIME; Rx  Chlor-Trimeton 4 MG Tablet;TAKE 1 TABLET EVERY 4 TO 6 HOURS AS NEEDED.; RPT  Clobetasol Propionate 0.05 % Cream;apply qhs to affected area; Rx  Losartan Potassium 50 MG Tablet;TAKE 1 TABLET DAILY.; Rx  Clarinex 5 MG Tablet;TAKE 1 TABLET DAILY.; Rx  Fluticasone Propionate 50 MCG/ACT Suspension;USE 2 SPRAYS IN EACH NOSTRIL   TWICE DAILY.; Rx.  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  Vital Signs   Recorded by dpope on 13 May 2010 10:06 AM  BP:144/54,  LUE,  Sitting,   HR: 72 b/min,   Temp: 98.4 F,   Height: 65 in, Weight: 147 lb, BMI: 24.5 kg/m2,    O2 Sat: 98 (%SpO2),  RA.  Physical Exam   GENERAL: Appears well. NAD. Color good. No cyanosis.  HEENT: Eyes: Conjunctiva without exudates.  EARS: Canals clear, tympanic membranes normal appearing, no effusions. Pt   wears  HAs  on  both sides   NOSE: Nares patent, septum straight, normal appearing mucosae, no exudate.  THROAT: Normal appearing mucosae, palate, and tongue. Tonsils are normal   appearing with no exudates, masses or ulcerations.  NECK: Trachea midline. No lymphadenopathy. Full ROM without pain or   tenderness.  RESPIRATORY: Has HVBS and an occ exp wheeze  COR: Normal precordium and PMI. Normal S1 and S2. No murmurs, rubs, or   gallops.  Assessment   Acute bilateral next is sinusitis/bronchitis: Patient will be started on   amoxicillin 500 mg p.o. t.i.d. for 10 days.  Patient should continue with   supportive care.  Patient was advised to use over-the-counter cough   medications.  Patient should continue using the antihistamines and the nose   sprays.  Plan   FUP: as planned already.  Signature   Electronically signed by: Gaye Pollack  M.D.; 05/19/2010 6:16 PM   EST.

## 2010-05-23 ENCOUNTER — Ambulatory Visit: Payer: Self-pay | Admitting: Internal Medicine

## 2010-05-23 ENCOUNTER — Ambulatory Visit
Admit: 2010-05-23 | Discharge: 2010-05-23 | Disposition: A | Discharge status: Home / Self Care | Payer: Self-pay | Source: Ambulatory Visit | Attending: Internal Medicine | Admitting: Internal Medicine

## 2010-05-23 LAB — TSH: TSH: 1.07 u[IU]/mL (ref 0.27–4.20)

## 2010-05-23 LAB — VITAMIN B12: Vitamin B12: 1661 pg/mL — ABNORMAL HIGH (ref 211–946)

## 2010-05-23 LAB — FERRITIN: Ferritin: 200 ng/mL — ABNORMAL HIGH (ref 10–120)

## 2010-05-24 ENCOUNTER — Ambulatory Visit
Admit: 2010-05-24 | Discharge: 2010-05-24 | Disposition: A | Discharge status: Home / Self Care | Payer: Self-pay | Source: Ambulatory Visit | Admitting: Internal Medicine

## 2010-05-24 ENCOUNTER — Ambulatory Visit
Admit: 2010-05-24 | Discharge: 2010-05-24 | Disposition: A | Discharge status: Home / Self Care | Payer: Self-pay | Source: Ambulatory Visit | Attending: Internal Medicine | Admitting: Internal Medicine

## 2010-05-24 DIAGNOSIS — R059 Cough, unspecified: Secondary | ICD-10-CM

## 2010-05-24 LAB — AEROBIC CULTURE: Aerobic Culture: 0

## 2010-05-25 ENCOUNTER — Ambulatory Visit
Admit: 2010-05-25 | Discharge: 2010-05-25 | Disposition: A | Discharge status: Home / Self Care | Payer: Self-pay | Source: Ambulatory Visit | Admitting: Internal Medicine

## 2010-05-26 NOTE — H&P (Signed)
 Reason For Visit   Kristine [name] is a 44 year year old female here for a physical. Productive   persistent cough clear thick sputum. Pt still taking amoxicillin as   ordered. Requested refill on Cheratussin.  dpopelpn.  HPI   Patient is here for a complete physical exam.  Patient unfortunately has started to cough again like last year.  Patient   required inpatient admission and several trips to the pulmonologist and   infectious disease specialist last year.     Patient denies chest pain, shortness of breath, orthopnea, PND, swelling of   ankles, palpitations, or hemoptysis.  No abdominal pain.  Bowels are   normal.  No bright red blood p.r. or melena.  Micturition normal.  Appetite   is good.  Weight is stable.  No weakness, numbness or claudication.       plans  for  a trip  to [place]      Has seasonal allergies.  Allergies   Bee sting  Cipro TABS; Headache.  Current Meds   ** Medication reconciliation completed. **.  Premarin 0.625 MG Tablet;TAKE 1 TABLET DAILY.; RPT  Multivitamins Tablet;TAKE 1 TABLET DAILY.; RPT  Calcium 600 + D 600-200 MG-UNIT TABS;TAKE 1 TABLET DAILY.; RPT  Aspirin 81 MG Tablet;TAKE 2 TABLET DAILY; Rx  Mirtazapine 15 MG Tablet;TAKE 1 TABLET AT BEDTIME.; Rx  Nitrostat 0.4 MG Tablet Sublingual;PLACE 1 TABLET UNDER THE TONGUE EVERY 5   MINUTES UP TO 3 DOSES AS NEEDED FOR CHEST PAIN.; Rx  Propranolol HCl 20 MG Tablet;TAKE 1 TABLET TWICE DAILY.; Rx  Chlor-Trimeton 4 MG Tablet;TAKE 1 TABLET EVERY 4 TO 6 HOURS AS NEEDED.; RPT  Clobetasol Propionate 0.05 % Cream;apply qhs to affected area; Rx  Losartan Potassium 50 MG Tablet;TAKE 1 TABLET DAILY.; Rx  Clarinex 5 MG Tablet;TAKE 1 TABLET DAILY.; Rx  Fluticasone Propionate 50 MCG/ACT Suspension;USE 2 SPRAYS IN EACH NOSTRIL   TWICE DAILY.; Rx  LORazepam 0.5 MG Tablet;TAKE 1 TABLET 3 TIMES DAILY AS NEEDED FOR ANXIETY;   Rx  Amoxicillin 500 MG Tablet;TAKE 1 TABLET 3 TIMES DAILY FOR 14 DAYS.; Rx  Amitriptyline HCl 10 MG Tablet;TAKE ONE TABLET AT BEDTIME;  Rx  Cheratussin AC 100-10 MG/5ML Syrup;TAKE 1 TEASPOONFUL EVERY 4 HOURS AS   NEEDED.; Rx.  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  PMH   Palpitations   HTN  Anxiety  GERD  Sinus bradycardia  First degree block  hyperchol  Hyper TGL   Atrophic vaginitis   Chr Insomnia.  PSH   Enterocele Repair, Vag. Approach (Sep Procedure)  Vaginal Hysterectomy.  Bladder suspension sx -- Dr Lynn Ito.  Family Hx   Family history of Breast Cancer  Dad died at 59--MI  Mom died at 3-- OP, [place] Seat -- Pneumonia   no Ca Colon, breast, uterus  Sister -- rheumatic heart dis   1 brother Ok  Pt is the oldest.  Personal Hx   No Alcohol Use  No Drug Use  Marital History  No Physically Abused  No Sexually Abused  No Sexually Active  No Smoking.  Married since 1950  1 son - in [facility]  [place] - artificial limb -- [facility]  2  daughters   1is CA -- Breast ca --one side  --mastectomy, has had Px oopherectomy  EtOH   use +++  the other daughter is in [place] and has had bilat prophylactic mastectomy and   oopherectomy   Used to be a heavy EtOH user   Non smoker  No recreational drugs.  ROS   CONSTITUTIONAL: Appetite good, no fevers, night sweats or weight loss  EYES: No visual changes, no eye pain  ENT: No hearing difficulties, no ear pain  CV: No chest pain, shortness of breath or peripheral edema  RESPIRATORY: has  cough, no wheezing or dyspnea  GI: No nausea/vomiting, abdominal pain, or change in bowel habits  GU: No dysuria, urgency or incontinence  MS: No joint pain/swelling or musculoskeletal deformities  SKIN: No rashes  NEURO: No MS changes, no motor weakness, no sensory changes  PSYCH: No  depression or anxiety  ENDOCRINE: No polyuria/polydipsia, no heat intolerance  HEME/LYMPH: No easy bleeding/bruising or swollen nodes  ALL/IMMUN: No allergic reactions.  Immunizations   PPD  Pneumo (Pneumovax); 25 May 1990  Td; 29 May 1993  Pneumo (Pneumovax); 25 May 1998  Td; 25 May 2002  Influenza; 01 Apr 2007  Influenza; 13 Mar 2008  H1N1 Influenza Inj; 11 Jul 2008  Zoster (Zostavax); 31 Aug 2008  PPD; 16 Jul 2009  Influenza; 18 Mar 2010.  Health Mgmt Plan   Colonoscopy every 5 years; for HEALTH MAINTENANCE; Overdue  DEXA scan every 3 years; for HEALTH MAINTENANCE; Overdue  Mammogram every 1 year; for HEALTH MAINTENANCE.  HCProxy DW pt   HIV risk factors NA  Car seat belt always  Dental visits reg   SBExam qmonthly  mammogram q yrly  DEXA scan good   Exercises reg  Calcium and vit d intake OK  Sun protection.  Vital Signs   Recorded by dpope on 23 May 2010 08:48 AM  Temp: 98.2 F,   O2 Sat: 96 (%SpO2),  RA.  Physical Exam   GENERAL APPEARANCE: Normal habitus. Well developed, well groomed. Appears   stated age. No acute distress. Color good.  MENTAL STATUS: Appears alert and oriented. Affect appropriate.  SKIN: Skin color and turgor normal. No suspicious lesions, masses, rashes,   or ulcerations. Nails and hair appear normal.  HEAD: Normocephalic.  EARS: External ear w/o scars, masses, or lesions. External auditory canal   intact, clear, and w/o lesions. TMs intact with normal light reflex and   landmarks. wears  hearing  aids EYES: PERRLA, extraocular movements intact.   Lids w/o defect, conjunctiva and sclera appear normal. Fundi w/o   papilledema, hemorrhage, exudates, or arterial abnormalities.  NOSE: Nasal mucosa and turbinates pink, septum midline, no lesions.  MOUTH: Teeth in good repair. Gums pink w/o lesions. Normal appearing   mucosa, palate, and tongue.   OROPHARYNX: Moist w/o exudate, erythema, or swelling.  NECK: Symmetric, trachea midline. Full ROM w/o pain or tenderness. Thyroid   nontender w/o  enlargement or masses. Carotid pulses normal with no bruits.   No cervical lymphadenopathy.  BREASTS: Breasts appear normal and symmetric w/o palpable masses, skin   changes, or nipple inversion. No discharge, rash, or skin retraction by   inspection. No axillary or supraclavicular lymphadenopathy.  CHEST: Respirations unlabored with normal diaphragmatic excursion. Chest   wall symmetric with no  masses. Breath sounds clear bilaterally w/o wheezes, rubs, rales, or   rhonchi.  CV: Normal precordium and PMI w/o lifts, heaves, or thrills. Normal S1 and   S2 w/o  murmur, rub, gallop, or click. Capillary refill within 2 seconds. No   edema, clubbing, or cyanosis. No varicosities. Radial, femoral, dorsalis   pedis, and posterior tibial pulses full and symmetrical.  GI/ABDOMEN: Abdomen soft with normal bowel sounds. No guarding or rebound.   No palpable masses or tenderness. Liver and spleen are w/o tenderness or   enlargement. No aortic widening. No inguinal adenopathy.  GU: not  done  RECTAL: not  done   MS: Muscle tone and strength normal for age, w/o atrophy or abnormal   movement.  EXTREMITIES: Joints w/full ROM, w/o tenderness, crepitus, or contracture.   No obvious joint deformities or effusions.  NEUROLOGICAL: Cranial nerves II-XII intact. Motor strength symmetrical with   no obvious weaknesses. Superficial sensation intact bilaterally to light   touch and pain. Observed dexterity w/o ataxia or tremor. Deep tendon   reflexes full and symmetric bilaterally. Gait coordinated and smooth.  Results   CBC,PLT/DIFF - CBCD   08 May 2010 07:45 AM  -   WBC: 7.5 thou/uL  -   RBC: 3.6 MIL/uL  -   HEMOGLOBIN: 11.3 g/dl  -   HEMATOCRIT: 34 %  -   MCV: 96 fL  -   RDW: 12.7 %  -   PLATELET COUNT: 251 thou/uL  -   NEUTROPHIL #: 3.1 thou/uL  -   LYMPHOCYTE #: 3.3 thou/uL  -   MONOCYTE #: 0.7 thou/uL  -   EOSINOPHIL #: 0.3 thou/uL  -   BASOPHIL #: 0.0 thou/uL  -   NEUTROPHILS: 42.0 %  -   LYMPHOCYTES: 44.4 %  -   MONOCYTES: 9.1 %  -    EOSINOPHILS: 4.0 %  -   BASOPHILS: 0.5 %  COMPREHENSIVE METABOLIC PROF - CMP   08 May 2010 07:45 AM  -   SODIUM: 137 mmol/L  -   POTASSIUM: 4.6 mmol/L  -   CHLORIDE: 102 mmol/L  -   CO2: 26 mmol/L  -   ANION GAP:

## 2010-05-27 ENCOUNTER — Ambulatory Visit
Admit: 2010-05-27 | Discharge: 2010-05-27 | Disposition: A | Discharge status: Home / Self Care | Payer: Self-pay | Source: Ambulatory Visit | Attending: Internal Medicine | Admitting: Internal Medicine

## 2010-05-27 LAB — VITAMIN D
25-OH VIT D2: <4 ng/mL
25-OH VIT D3: 46 ng/mL
25-OH Vit Total: 46 ng/mL (ref 30–80)

## 2010-05-27 LAB — GRAM STAIN: Gram Stain: 0

## 2010-05-28 LAB — AFB STAIN
AFB Stain: 0
AFB Stain: 0
AFB Stain: 0

## 2010-05-29 LAB — AEROBIC CULTURE: Aerobic Culture: 0

## 2010-05-30 ENCOUNTER — Ambulatory Visit: Payer: Self-pay | Admitting: Internal Medicine

## 2010-05-30 LAB — LEGIONELLA CULTURE: Legionella Culture: 0

## 2010-05-31 ENCOUNTER — Ambulatory Visit
Admit: 2010-05-31 | Discharge: 2010-05-31 | Disposition: A | Discharge status: Home / Self Care | Payer: Self-pay | Source: Ambulatory Visit | Attending: Internal Medicine | Admitting: Internal Medicine

## 2010-05-31 ENCOUNTER — Other Ambulatory Visit: Payer: Self-pay | Admitting: Internal Medicine

## 2010-06-03 LAB — FOLATE RBC: RBC Folate: 1392 ng/mL — ABNORMAL HIGH (ref 180–614)

## 2010-06-03 NOTE — Progress Notes (Signed)
 Reason For Visit   Consultation. Pt's husband here to speak with MD. Pt not present. Husband   concerned about wife's cough and their scheduled trip to [place] next   thurday.  dpopelpn.  HPI   Patient's husband came to discuss about patient's condition.  Patient then   the husband is planning to go to [place] for Christmas next week.    Patient has a history of chronic cough and had a rough time last year.    Patient had a lot of investigations and was thought to have an atypical   mycobacterial infection that time.  That was treated with the help of an   infectious disease specialist and pulmonologist.     The patient's cough improved and stopped the medications.     Almost a year later around the same time patient started to have cough   again.  Patient has been treated with antibiotics and symptomatic care   measures.  It does not completely help.  Patient had a chest x-ray showed   some scarring of the right middle  lobe and resolving with effusion on the   right side.  Patient wants to go for a CAT scan of the chest     according to the patient's husband patient does not have any fever or   chills.  No hemoptysis.  Patient has this chronic nagging cough.     At one point they thought that he might have allergies to the bird   droppings he they were supposed to remove the bird nests  and bat   droppings.  They live in a very older but [place] -house. these are   particularly above the ear air conditioning and heating unit.  Allergies   Bee sting  Cipro TABS; Headache.  Current Meds   ** Medication reconciliation completed. **.  Premarin 0.625 MG Tablet;TAKE 1 TABLET DAILY.; RPT  Multivitamins Tablet;TAKE 1 TABLET DAILY.; RPT  Calcium 600 + D 600-200 MG-UNIT TABS;TAKE 1 TABLET DAILY.; RPT  Aspirin 81 MG Tablet;TAKE 2 TABLET DAILY; Rx  Mirtazapine 15 MG Tablet;TAKE 1 TABLET AT BEDTIME.; Rx  Nitrostat 0.4 MG Tablet Sublingual;PLACE 1 TABLET UNDER THE TONGUE EVERY 5   MINUTES UP TO 3 DOSES AS NEEDED FOR  CHEST PAIN.; Rx  Propranolol HCl 20 MG Tablet;TAKE 1 TABLET TWICE DAILY.; Rx  Chlor-Trimeton 4 MG Tablet;TAKE 1 TABLET EVERY 4 TO 6 HOURS AS NEEDED.; RPT  Clobetasol Propionate 0.05 % Cream;apply qhs to affected area; Rx  Losartan Potassium 50 MG Tablet;TAKE 1 TABLET DAILY.; Rx  Clarinex 5 MG Tablet;TAKE 1 TABLET DAILY.; Rx  Fluticasone Propionate 50 MCG/ACT Suspension;USE 2 SPRAYS IN EACH NOSTRIL   TWICE DAILY.; Rx  LORazepam 0.5 MG Tablet;TAKE 1 TABLET 3 TIMES DAILY AS NEEDED FOR ANXIETY;   Rx  Amoxicillin 500 MG Tablet;TAKE 1 TABLET 3 TIMES DAILY FOR 14 DAYS.; Rx  Amitriptyline HCl 10 MG Tablet;TAKE ONE TABLET AT BEDTIME; Rx  Cheratussin AC 100-10 MG/5ML Syrup;TAKE 1 TEASPOONFUL EVERY 4 HOURS AS   NEEDED.; Rx.  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  Physical Exam   Patient was not present.  Assessment   Chronic cough :a  lot of time was spent with her husband going over the   possibilities.  Patient was advised to consider going back to the   pulmonologist of the infectious disease specialist.  Patient already has   had a chest x-ray.  Patient's husband wants her to have a CAT scan of the   chest.  This will be arranged.  Depending on the results patient had to go   back to the pulmonologist.  Patient wants to do that but after he finishes   the tip to [place].  Plan   Patient will followed up after the CAT scan of the chest is done.  Signature   Electronically signed by: Gaye Pollack  M.D.; 06/03/2010 4:28 AM   EST.

## 2010-06-12 ENCOUNTER — Ambulatory Visit: Payer: Self-pay | Admitting: Internal Medicine

## 2010-06-12 ENCOUNTER — Encounter: Payer: Self-pay | Admitting: Internal Medicine

## 2010-06-23 NOTE — Procedures (Signed)
[facility] Cardiology   ECHOCARDIOGRAM     Name: Kristine [name], Kristine [name]   MRN: [id number]   Age 77   DOB: [birth year]   Sex Female   Ht: 162.6 cm   Wt: 65.8 kg      Procedure Date: 08/24/2009   Sonographer: Ancil Linsey, RDCS, RVT   Referring Provider: Dr Tish Men   Location: [phone]   Indication: CHF        2D    LVIDs 2.6 cm   LA Diam 3.4 cm   Ao Diam 2.8 cm   Ao asc 3.1 cm   EDV(Teich) 73.28 ml   IVSd 1.63 cm   LV Mass Index 71.67 g/m?   LVIDd 4.08 cm   LVPWd 1.27 cm      Doppler    MV E Vel 0.74 m/s   MV DecT 212 ms   MV Dec Slope 4.2 m/s?   MV A Vel 1.37 m/s   MV E/A Ratio 0.54    E' 0.07 m/s   E/E' 10.83    TR Vmax 3.04 m/s   TR maxPG 36.95 mmHg   RAP 10.00 mmHg   RVSP 46.95 mmHg         Findings      The patient's identity was confirmed using at least 2 separate identifiers   and appropriate test verified with physician order. The patient was   instructed and verbalizes understanding of test procedure. Other than   cardiac symptoms, the patient is not experiencing pain that impacts their   ability to complete the test.  Study Indication: Congestive heart failure.  ECG rhythm: Sinus rhythm  Technical quality: Technically difficult study with suboptimal views.  Contrast/4D imaging: Echo contrast not used.  Left Ventricle: Moderate concentric left ventricular hypertrophy with   normal chamber size. Systolic function is moderately reduced. There is   hypokinesis of the septum. Estimated left ventricular ejection fraction is   40%.  Left ventrical relaxation is consistent with grade 1 diastolic dysfunction.  Right Ventricle: Normal right ventricular size and function.   Left Atrium: Grossly normal left atrial size.  Right Atrium: Right atrium appears grossly normal (limited imaging).  ASD/VSD: No evidence of patent foramen ovale by colorflow doppler analysis.  Aortic Valve: Aortic valve is trileaflet and is mildly thickened. Mild   aortic valve sclerosis without stenosis. No evidence of aortic valve   regurgitation.  Mitral  Valve: Mild thickening of the mitral valve leaflets. Mild mitral   valve regurgitation. No mitral valve stenosis.  Tricuspid Valve: Normal tricuspid valvular structure and function. Mild   tricuspid valve regurgitation. Moderate pulmonary hypertension. Right   ventricular systolic pressure, as measured by Doppler, is 46.35mmHg.  Pulmonic Valve: Normal pulmonic valvular structure and function.   Aorta: Aortic root and proximal ascending aortic dimensions appear normal.   Thrombus: No evidence of intracardiac mass or thrombus.  Pericardium: Normal pericardium with no evidence of a pericardial effusion.   Large (left/right)pleural effusion   present.                                                      Summary      1. Moderate concentric left ventricular hypertrophy with reduced systolic   function.  2. Septal hypokinesis with estimated left ventricular ejection fraction of   40%.  3. Grade 1 diastolic dysfunction.  4. Normal right ventricular size and function.   5. Aortic and mitral valve sclerosis.  6. Mild mitral and tricuspid valve regurgitation.  7. Moderate pulmonary hypertension.  8. Large pleural effusion  9. Compared with prior report of7/21/05; there is now septal hypokinesis.                                                         Electronically signed off by:  Tanise Russman. [place] Davidson Palmieri, MD, RPVI  .  Signature   Electronically signed by: Thayer Headings  M.D.,FACC; 08/24/2009 6:26 PM EST.

## 2010-06-23 NOTE — Miscellaneous (Unsigned)
 Continuity of Care Record  Created: todo  From: Kristine [name]  From:   From: [employer], EHR v10.2.7.53  To: Kristine [name]  Purpose: Patient Use;       Problems  Diagnosis: Actinic Keratosis (702.0)   Diagnosis: Anxiety Disorder NOS (300.00)   Problem: Back Pain  Diagnosis: Calcific Aortic Stenosis (424.1)   Diagnosis: Depression (311)   Diagnosis: Esophageal Reflux (530.81)   Diagnosis: Essential Hypertriglyceridemia (272.1)   Problem: Health Care Proxy In Chart  Diagnosis: Hemorrhoids (455.6)   Diagnosis: Herpes Zoster (Shingles) (053.9)   Diagnosis: Hyperactivity Of The Bladder (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.51)   Diagnosis: Hypertension (401.9)   Diagnosis: Osteoporosis (733.00)   Diagnosis: Palpitations (785.1)   Diagnosis: Rosacea (695.3)     Family History  Family history of Breast Cancer (V16.3)     Social History  Marital History  No History of Physically Abused  No History of Sexually Abused  No History of Sexually Active  No History of Smoking  No History of Alcohol Use  No History of Drug Use    Alerts  Allergy - Bee sting   Allergy - Cipro TABS Headache     Medications  Amitriptyline HCl 10 MG Tablet; TAKE ONE TABLET AT BEDTIME ; Rx   Aspirin 81 MG Tablet; TAKE 2 TABLET DAILY ; Rx   Calcium 600 + D 600-200 MG-UNIT TABS; TAKE 1 TABLET DAILY. ; RPT   Cheratussin AC 100-10 MG/5ML Syrup; TAKE 1 TEASPOONFUL EVERY 4 HOURS AS   NEEDED. ; Rx   Chlor-Trimeton 4 MG Tablet; TAKE 1 TABLET EVERY 4 TO 6 HOURS AS NEEDED. ;   RPT   Clarinex 5 MG Tablet; TAKE 1 TABLET DAILY. ; Rx   Clobetasol Propionate 0.05 % Cream; apply qhs to affected area ; Rx   Fluticasone Propionate 50 MCG/ACT Suspension; USE 2 SPRAYS IN EACH NOSTRIL   TWICE DAILY. ; Rx   LORazepam 0.5 MG Tablet; TAKE 1 TABLET 3 TIMES DAILY AS NEEDED FOR ANXIETY   ; Rx   Losartan Potassium 50 MG Tablet; TAKE 1 TABLET DAILY. ; Rx   Mirtazapine 15 MG Tablet; TAKE 1 TABLET AT BEDTIME. ; Rx   Multivitamins Tablet; TAKE 1 TABLET DAILY. ; RPT    Nitrostat 0.4 MG Tablet Sublingual; PLACE 1 TABLET UNDER THE TONGUE EVERY 5   MINUTES UP TO 3 DOSES AS NEEDED FOR CHEST PAIN. ; Rx   Premarin 0.625 MG Tablet; TAKE 1 TABLET DAILY. ; RPT   Propranolol HCl 20 MG Tablet; TAKE 1 TABLET TWICE DAILY. ; Rx     Immunizations  PPD   Pneumo (Pneumovax)   Td   Pneumo (Pneumovax)   Td   Influenza   Influenza   H1N1 Influenza Inj   Zoster (Zostavax)   PPD   Influenza

## 2010-06-23 NOTE — Letter (Signed)
September 11, 2009    Gaye Pollack, MD  [address]  [place], [place]  [address]      RE:   Kristine [name], Kristine [name]  DOB:  1926-10-23  Unit#: [id number]    Dear Dr. Tish Men:    I am seeing Eugene Garnet today in follow up for her hospitalization for  empyema. She was admitted to [facility] on March 7th and discharged  on March 18th. During that time she had a pigtail catheter placed for  drainage of an empyema for about nine days. Her hospital course was  complicated by respiratory specimen positive for acid fast Bacillus. 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 She was treated briefly with  anti-tuberculous medicines, which were stopped. On discharge she was placed  on amoxicillin clavulanate 875mg  BID. She is continuing to take this. It is  giving her a little GI upset, but that is about it.  She has some dyspnea, but very little pain. She has a cough, which is  occasionally productive of clear sputum. Overall she feels improved. Night  sweats, which she was having prior to the treatment, have not recurred.  On physical examination, her weight is 140 pounds, blood pressure 144/60,  pulse 96, and temperature 36.4 C. There is dullness on the right side up  about halfway. There are only a few rales.  I prefer to use amoxicillin clavulanate 500mg  TID over the BID dosing. This  will provide better coverage for organisms with a slightly higher MIC. We  will continue this therapy for weeks to months. She has currently been on  treatment for 21 days. I will transmit in the change in prescription and  expect to see her back in three weeks. She will have an x-ray in that time.  The current x-ray shows definite improvement over the start of therapy and  modest improvement since leaving the hospital. If you have any questions,  please give me a call.      Sincerely,              Electronically Signed and Finalized by  Susette Racer, MD  09/26/2009 22:21  ____________________________________  Susette Racer, MD          DD:   09/11/2009  DT:   09/21/2009  1:49 P  YNW/GN#[id number]    cc:   Gaye Pollack, MD        Darl Householder, MD

## 2010-06-23 NOTE — Letter (Signed)
October 05, 2009    Gaye Pollack, MD  [address]  [place], [place]  [address]      RE:   Kristine [name], Kristine [name]  DOB:  09/16/1926  Unit#: [id number]    Dear Dr. Tish Men:    I saw Kristine [name] today in follow up for her empyema. At present, she  is on rifampin 300mg  PO Q 12. She did finish a course of amoxicillin  clavulanate 875mg  BID for approximately 1 month. She still complains of a  cough and feeling weak and she has lost weight. She states that she doesn't  have an appetite and she thinks it is due to the rifampin. She denies any  night sweats. No chills, no fever, and no rigors at this point. She denies  any nausea, vomiting, diarrhea, constipation, hematochezia, or melena. She  does have some dyspnea at this point. She denies any pain also.  On physical exam, well developed, well nourished, 77 year old Caucasian  female in no apparent distress. Today's weight was 140 pounds. Blood  pressure 146/70, pulse 104, respirations 18, temperature 36.8C. Heart S1,  S2, regular rate and rhythm, no murmur, no rub, no gallop. Abdomen was  soft. Bowel sounds present in all four quadrants, nontender to palpation.  No guarding, rebound, or masses noted. Lungs clear to auscultation. No  crackles, wheezes, rhonchi.  PLAN: At present, we would recommend that [name] remain on  rifampin 300mg  PO BID and follow up in approximately 3 weeks. We have also  asked her to obtain CBC, basic metabolic profile, and a CRP once every two  weeks at this point, and we will see how she progresses. Thank you very  much for the opportunity to see the patient.        Sincerely,  Dictated by:  Randol Kern, NP        Co-signature  In retrospect, the rifampin was being used when we did not know  if she had TB in her sputum. We now know that that was an atypical  mycobacterium that did not grow from the pleural space. In either case,  rifampin is used as monotherapy only in treating latent TB. I will let the  patient know that she can  stop this medication. MS 10/26/2009    Electronically Signed and Finalized by  Susette Racer, MD 10/26/2009 17:31  ____________________________________  Susette Racer, MD          DD:   10/05/2009  DT:   10/15/2009  5:51 P  /YN#[id number]    cc:   Gaye Pollack, MD        Darl Householder, MD

## 2010-06-23 NOTE — Progress Notes (Signed)
 Reason For Visit   Productive cough yellowish brown sputum, ST and weakness 3 days  dpopelpn.  HPI   Cough  with  yellow  to  brown  sputum  no hemoptysis  had  a sorethroat      Missed  going   to [place]  for  celebrating Christmas      is  getting  rec  bronchitis     had  a CxR  and CT scan of the chest two showed chronic changes in the   right middle and lower lobe.  Patient had pleural effusion last year.    Patient was treated for atypical mycobacterial infection for two months at   that time.  Patient was being followed by a pulmonologist Dr. Tama High and an   infectious disease specialist Dr. Vance Gather.     medication and allergies are as in the chart.  .  Allergies   Bee sting  Cipro TABS; Headache.  Current Meds   ** Medication reconciliation completed. **.  Premarin 0.625 MG Tablet;TAKE 1 TABLET DAILY.; RPT  Multivitamins Tablet;TAKE 1 TABLET DAILY.; RPT  Calcium 600 + D 600-200 MG-UNIT TABS;TAKE 1 TABLET DAILY.; RPT  Aspirin 81 MG Tablet;TAKE 2 TABLET DAILY; Rx  Mirtazapine 15 MG Tablet;TAKE 1 TABLET AT BEDTIME.; Rx  Nitrostat 0.4 MG Tablet Sublingual;PLACE 1 TABLET UNDER THE TONGUE EVERY 5   MINUTES UP TO 3 DOSES AS NEEDED FOR CHEST PAIN.; Rx  Propranolol HCl 20 MG Tablet;TAKE 1 TABLET TWICE DAILY.; Rx  Chlor-Trimeton 4 MG Tablet;TAKE 1 TABLET EVERY 4 TO 6 HOURS AS NEEDED.; RPT  Clobetasol Propionate 0.05 % Cream;apply qhs to affected area; Rx  Losartan Potassium 50 MG Tablet;TAKE 1 TABLET DAILY.; Rx  Clarinex 5 MG Tablet;TAKE 1 TABLET DAILY.; Rx  Fluticasone Propionate 50 MCG/ACT Suspension;USE 2 SPRAYS IN EACH NOSTRIL   TWICE DAILY.; Rx  LORazepam 0.5 MG Tablet;TAKE 1 TABLET 3 TIMES DAILY AS NEEDED FOR ANXIETY;   Rx  Amitriptyline HCl 10 MG Tablet;TAKE ONE TABLET AT BEDTIME; Rx  Cheratussin AC 100-10 MG/5ML Syrup;TAKE 1 TEASPOONFUL EVERY 4 HOURS AS   NEEDED.; Rx.  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  POCT   RAPID STREP RESULT:  June 12, 2010   1:12PM        Rapid Antigen:  Negative  .  Vital Signs   Recorded by dpope on 12 Jun 2010 12:59 PM  BP:138/68,  LUE,  Sitting,   HR: 80 b/min,   Temp: 98.1 F,   Height: 65 in, Weight: 149 lb, BMI: 24.8 kg/m2,   O2 Sat: 98 (%SpO2),  RA.  Physical Exam   GENERAL APPEARANCE: Appears stated age, well appearing  anxious as usual.  Patient is afebrile.  Patient had a ticklish cough during exam.  Patient had normal oxygen saturation.  HEENT: PERRL, EOMI, TMs normal, oropharynx was congested.  Rapid strep test   was negative.  LUNGS: Clear to auscultation and percussion except for some harsh vesicular   breath sounds and a few crackles in the right base.  HEART: Normal S1,S2 without murmurs, gallops, or rubs  ABDOMEN: NABS, soft, non-tender, without hepato-splenomegaly  EXTREMITIES: Without clubbing,  cyanosis, or edema  NEUROLOGIC: Alert and oriented x3,  Assessment   Acute pharyngitis: Strep test is negative.  Patient was reassured.  No need   for antibiotics.     Chronic cough, patient had x-rays and a CAT scan of the chest.  Patient   will be referred to the pulmonologist.  In the meantime patient will   continue with supportive care.  Patient was given a prescription for   Cheratussin AC.  Plan   Follow-up: As previously planned.  Signature   Electronically signed by: Gaye Pollack  M.D.; 06/23/2010 4:17 PM   EST.

## 2010-06-23 NOTE — Letter (Signed)
Nov 02, 2009    Gaye Pollack, MD  [address]  [place], [place]  [address]      RE:   Kristine [name], Kristine [name]  DOB:  September 22, 1926  Unit#: [id number]    Dear Dr. Tish Men:    I saw Barry Agosta today in follow up for her empyema. She was last seen  her April 22nd. At that point she was on rifampin alone. She had a  persistent cough so we continued therapy. However, her lisinopril was  stopped and she had subsequent recovery from that. She is not dyspneic.  On physical examination, her weight is 138 pounds, blood pressure 142/84,  pulse 76, and temperature 36.8 C. She has a little bit of dullness, but no  rales at the right base. There is good air movement.  Empyema. She is doing reasonably well off any antibiotic therapy. I would  continue to go off antibiotics and simply observe for recurrent fluid  collection. Fever, chills, or sweats would also be a reason to pursue  earlier evaluation. I will have her come back only as needed. If you have  any questions, please give me a call.    Sincerely,              Electronically Signed and Finalized by  Susette Racer, MD 11/23/2009 17:55  ____________________________________  Susette Racer, MD          DD:   11/02/2009  DT:   11/13/2009 12:04 P  KVQ/QV#[id number]    cc:   Gaye Pollack, MD        Darl Householder, MD

## 2010-06-28 ENCOUNTER — Ambulatory Visit: Payer: Self-pay | Admitting: Pulmonology

## 2010-07-05 ENCOUNTER — Ambulatory Visit
Admit: 2010-07-05 | Discharge: 2010-07-05 | Disposition: A | Discharge status: Home / Self Care | Payer: Self-pay | Source: Ambulatory Visit | Attending: Pulmonology | Admitting: Pulmonology

## 2010-07-05 ENCOUNTER — Ambulatory Visit: Payer: Self-pay | Admitting: Pulmonology

## 2010-07-05 LAB — IGM: IgM: 58 mg/dL (ref 40–230)

## 2010-07-05 LAB — IGG: IgG: 990 mg/dL (ref 700–1600)

## 2010-07-05 LAB — IGA: IgA: 284 mg/dL (ref 70–400)

## 2010-07-19 LAB — AFB CULTURE
AFB Culture: 0
AFB Culture: 0
AFB Culture: 0

## 2010-08-16 ENCOUNTER — Ambulatory Visit: Payer: Self-pay | Admitting: Internal Medicine

## 2010-08-16 ENCOUNTER — Encounter: Payer: Self-pay | Admitting: Internal Medicine

## 2010-08-18 NOTE — Progress Notes (Signed)
 Reason For Visit   Persistent productive cough yellow sputum 5 days.  dpopelpn.  HPI   Cough  with  yellow  expectoration   No fever  or  chills   Has  a nasal d/c   Has no SOB   No  wheeze      Had  seen Dr Tama High after the CAT scan of the chest was done in December   2011.  At that time she has improved after a course off the Z-Pak.  Patient   had some improvement in the scan though she has persistent spotting.     Medications and allergies are as in the chart.     Patient does not smoke.  Allergies   Bee sting  Cipro TABS; Headache.  Current Meds   ** Medication reconciliation completed. **.  Premarin 0.625 MG Tablet;TAKE 1 TABLET DAILY.; RPT  Multivitamins Tablet;TAKE 1 TABLET DAILY.; RPT  Calcium 600 + D 600-200 MG-UNIT TABS;TAKE 1 TABLET DAILY.; RPT  Aspirin 81 MG Tablet;TAKE 2 TABLET DAILY; Rx  Mirtazapine 15 MG Tablet;TAKE 1 TABLET AT BEDTIME.; Rx  Nitrostat 0.4 MG Tablet Sublingual;PLACE 1 TABLET UNDER THE TONGUE EVERY 5   MINUTES UP TO 3 DOSES AS NEEDED FOR CHEST PAIN.; Rx  Chlor-Trimeton 4 MG Tablet;TAKE 1 TABLET EVERY 4 TO 6 HOURS AS NEEDED.; RPT  Clobetasol Propionate 0.05 % Cream;apply qhs to affected area; Rx  Losartan Potassium 50 MG Tablet;TAKE 1 TABLET DAILY.; Rx  Clarinex 5 MG Tablet;TAKE 1 TABLET DAILY.; Rx  Fluticasone Propionate 50 MCG/ACT Suspension;USE 2 SPRAYS IN EACH NOSTRIL   TWICE DAILY.; Rx  Propranolol HCl 20 MG Tablet;TAKE 1 TABLET TWICE DAILY.; Rx  Azithromycin 250 MG Tablet;TAKE 2 TABLETS ON DAY 1 THEN TAKE 1 TABLET A DAY   FOR 4 DAYS.; Rx  Cheratussin AC 100-10 MG/5ML Syrup;TAKE 1 TEASPOONFUL EVERY 4 HOURS AS   NEEDED.; Rx  Amitriptyline HCl 10 MG Tablet;TAKE ONE TABLET AT BEDTIME; Rx  LORazepam 0.5 MG Tablet;TAKE 1 TABLET 3 TIMES DAILY AS NEEDED FOR ANXIETY;   Rx.  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  Vital Signs   Recorded by dpope on 16 Aug 2010 09:22 AM  BP:116/66,  RUE,  Sitting,   HR: 74 b/min,  L Radial, Regular,   Temp: 98.9 F,   Height: 65 in, Weight: 147 lb, BMI: 24.5 kg/m2,   O2 Sat: 94 (%SpO2),  RA.  Physical Exam   GENERAL: Appears well. NAD. Color good. No cyanosis.  anxious  HEENT: Eyes: Conjunctiva without exudates.  EARS: Canals clear, tympanic membranes normal appearing, no effusions.  NOSE: Nares patent, septum straight, normal appearing mucosae, no exudate.  THROAT: Normal appearing mucosae, palate, and tongue. Tonsils are normal   appearing with no exudates, masses or ulcerations.  NECK: Trachea midline. No lymphadenopathy. Full ROM without pain or   tenderness.  RESPIRATORY: patient has harsh vesicular breath sounds with occasional   expiratory wheezing.  Patient did not have any crackles.  COR: Normal precordium and PMI. Normal S1 and S2. No murmurs, rubs, or   gallops.  Assessment   Acute on chronic bronchitis: Patient will be treated again with a  course of   Z-Pak and Cheratussin.  Patient has an appointment to come back and we will   review her then.  Plan   Follow-up: As previously planned.  Signature   Electronically signed by: Gaye Pollack  M.D.; 08/18/2010 1:13 PM   EST.

## 2010-08-20 ENCOUNTER — Ambulatory Visit: Payer: Self-pay | Admitting: Pulmonology

## 2010-08-26 ENCOUNTER — Ambulatory Visit
Admit: 2010-08-26 | Discharge: 2010-08-26 | Disposition: A | Discharge status: Home / Self Care | Payer: Self-pay | Source: Ambulatory Visit | Attending: Pulmonology | Admitting: Pulmonology

## 2010-08-26 ENCOUNTER — Encounter: Payer: Self-pay | Admitting: Internal Medicine

## 2010-08-26 ENCOUNTER — Ambulatory Visit: Payer: Self-pay | Admitting: Internal Medicine

## 2010-08-26 LAB — GRAM STAIN

## 2010-08-28 LAB — AEROBIC CULTURE: Aerobic Culture: 0

## 2010-09-01 NOTE — Progress Notes (Signed)
 Reason For Visit   3 month FU palpitations. Pt completed Z-pak as ordered. Med list updated.  dpopelpn.  HPI   Is  afraid  of  exposing  to dust  in the [employer]. 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     Has some bronchial hyperactivity      Has  just  finished  course  of  Z pak   feels   a bit  better      Wants  to have  the cough  syrup      Going  to [place]  in the end  of the  month. They  missed  the rtrip in the    Christmas  time      No fever or chills      No abd pain      Is not spitting  up any phlegm  at all   .  Allergies   Bee sting  Cipro TABS; Headache.  Current Meds   ** Medication reconciliation completed. **.  Premarin 0.625 MG Tablet;TAKE 1 TABLET DAILY.; RPT  Multivitamins Tablet;TAKE 1 TABLET DAILY.; RPT  Calcium 600 + D 600-200 MG-UNIT TABS;TAKE 1 TABLET DAILY.; RPT  Aspirin 81 MG Tablet;TAKE 2 TABLET DAILY; Rx  Mirtazapine 15 MG Tablet;TAKE 1 TABLET AT BEDTIME.; Rx  Nitrostat 0.4 MG Tablet Sublingual;PLACE 1 TABLET UNDER THE TONGUE EVERY 5   MINUTES UP TO 3 DOSES AS NEEDED FOR CHEST PAIN.; Rx  Chlor-Trimeton 4 MG Tablet;TAKE 1 TABLET EVERY 4 TO 6 HOURS AS NEEDED.; RPT  Clobetasol Propionate 0.05 % Cream;apply qhs to affected area; Rx  Losartan Potassium 50 MG Tablet;TAKE 1 TABLET DAILY.; Rx  Clarinex 5 MG Tablet;TAKE 1 TABLET DAILY.; Rx  Fluticasone Propionate 50 MCG/ACT Suspension;USE 2 SPRAYS IN EACH NOSTRIL   TWICE DAILY.; Rx  Amitriptyline HCl 10 MG Tablet;TAKE ONE TABLET AT BEDTIME; Rx  Propranolol HCl 20 MG Tablet;TAKE 1 TABLET TWICE DAILY.; Rx  LORazepam 0.5 MG Tablet;TAKE 1 TABLET 3 TIMES DAILY AS NEEDED FOR ANXIETY;   Rx  Cheratussin AC 100-10 MG/5ML Syrup;TAKE 1 TEASPOONFUL EVERY 4 HOURS AS   NEEDED.; Rx.  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  Vital Signs   Recorded by dpope on 26 Aug 2010 08:13 AM  BP:144/66,  LUE,  Sitting,   HR: 86 b/min,  R Radial, Regular,   Height: 65 in, Weight: 148 lb, BMI: 24.6 kg/m2,   O2 Sat: 97 (%SpO2),  RA.  Physical Exam   GENERAL APPEARANCE: Appears stated age, well appearing, NAD  HEENT: PERRL, EOMI, TMs normal, oropharynx clear  LUNGS: Clear to auscultation and percussion  HEART: Normal S1,S2 without murmurs, gallops, or rubs  ABDOMEN: NABS, soft, non-tender, without hepato-splenomegaly  EXTREMITIES: Without clubbing, cyanosis, or edema  NEUROLOGIC: Alert and oriented x3, cranial nerves II-XII intact,   motor/sensory exam normal, DTRs symmetric, normal gait.  Assessment   Acute Bronchitis: patient has just finished a course of Z-Pak.  Patient   does not require any further antibiotics.  Patient was given a prescription   for Cheratussin for relief from  the cough.  Patient will continue with   supportive care.  Patient has to be careful in avoiding dusty places and   any other allergens.  Patient was reassured.     Anxiety: Patient will continue with the anxiolytics which were reviewed   today.  Patient was advised to be remaining calm.  Hopefully her trip to   [place] for make her less anxious and relaxed.  Plan   Follow up: 2 months or earlier if needed.  Signature   Electronically signed by: Gaye Pollack  M.D.; 09/01/2010 6:52 PM   EST.

## 2010-09-03 LAB — LEGIONELLA CULTURE: Legionella Culture: 0

## 2010-11-13 ENCOUNTER — Other Ambulatory Visit: Payer: Self-pay | Admitting: Internal Medicine

## 2010-12-10 ENCOUNTER — Ambulatory Visit: Payer: Self-pay | Admitting: Internal Medicine

## 2011-04-17 ENCOUNTER — Ambulatory Visit
Admit: 2011-04-17 | Discharge: 2011-04-17 | Disposition: A | Discharge status: Home / Self Care | Payer: Self-pay | Source: Ambulatory Visit | Attending: Obstetrics | Admitting: Obstetrics

## 2011-04-24 LAB — GYN CYTOLOGY

## 2011-09-02 ENCOUNTER — Other Ambulatory Visit: Payer: Self-pay | Admitting: Internal Medicine

## 2011-11-18 ENCOUNTER — Encounter: Payer: Self-pay | Admitting: Pulmonology

## 2011-11-18 ENCOUNTER — Ambulatory Visit: Payer: Self-pay | Admitting: Pulmonology

## 2011-11-18 ENCOUNTER — Ambulatory Visit
Admit: 2011-11-18 | Discharge: 2011-11-18 | Disposition: A | Discharge status: Home / Self Care | Payer: Self-pay | Source: Ambulatory Visit | Admitting: Pulmonology

## 2011-11-18 VITALS — BP 142/80 | HR 75 | Ht 65.0 in | Wt 151.0 lb

## 2011-11-18 DIAGNOSIS — R0602 Shortness of breath: Secondary | ICD-10-CM

## 2011-11-18 NOTE — Progress Notes (Signed)
This patient returns, I last saw her about 18 months ago.  She had a very severe cough at one point, and then had an empyema that required hospitalization.  That was treated successfully, she has done well.  She does have occasional cough but much less than before.  She underwent a CT scan earlier this year that showed some areas of bronchiectasis and mild organization in the anterior segments.  She denies any fever chills, her appetite is good and she is maintaining her weight.  She is not producing any purulent secretions, denies any wheezing.  She is not short of breath.  She has been able to do outside activities.    BP 142/80  Pulse 75  Ht 1.651 m (5\' 5" )  Wt 68.493 kg (151 lb)  BMI 25.13 kg/m2   The thyroid is normal and there are no masses in the neck.   Breath sounds are equal bilaterally and are clear to auscultation.  The chest is normal to percussion.  No crackles and wheezes were heard on auscultation. The patient is in normal sinus rhythm.  There is no S3, S4 or murmur heard.  Carotid pulses are equal bilaterally.  No carotid bruits are heard. The liver is of normal size and the spleen was not felt.  Bowel sounds were normal and no abdominal masses could be palpated. There is no palpable lymphadenopathy in the neck, axilla, or groin. There is no clubbing, cyanosis or edema of the extremities.    Her spirometry today showed similar values to previous with an FEV1 and FVC of 56 and 58% of predicted.  These are virtually identical to those done in may, 2011.    At this point I have no other measures that I would institute.  She does have some bronchiectatic changes in her lung CT scan but I don't believe this needs any further investigation or treatment unless she has symptoms.  She would like to be seen again in a year and I will accommodate that.

## 2011-11-18 NOTE — Procedures (Signed)
Reduced FEV1 and FVC typical of restrictive lung disease

## 2012-01-09 ENCOUNTER — Ambulatory Visit
Admit: 2012-01-09 | Discharge: 2012-01-09 | Disposition: A | Discharge status: Home / Self Care | Payer: Self-pay | Source: Ambulatory Visit | Attending: Internal Medicine | Admitting: Internal Medicine

## 2012-01-15 ENCOUNTER — Ambulatory Visit
Admit: 2012-01-15 | Discharge: 2012-01-15 | Disposition: A | Discharge status: Home / Self Care | Payer: Self-pay | Source: Ambulatory Visit | Attending: Internal Medicine | Admitting: Internal Medicine

## 2012-01-15 LAB — COMPREHENSIVE METABOLIC PANEL
ALT: 12 U/L (ref 0–35)
AST: 22 U/L (ref 0–35)
Albumin: 4 g/dL (ref 3.5–5.2)
Alk Phos: 42 U/L (ref 35–105)
Anion Gap: 9 (ref 7–16)
Bilirubin,Total: 0.4 mg/dL (ref 0.0–1.2)
CO2: 27 mmol/L (ref 20–28)
Calcium: 8.9 mg/dL (ref 8.6–10.2)
Chloride: 98 mmol/L (ref 96–108)
Creatinine: 0.98 mg/dL — ABNORMAL HIGH (ref 0.51–0.95)
GFR,Black: 62 *
GFR,Caucasian: 54 * — AB
Glucose: 86 mg/dL (ref 60–99)
Lab: 20 mg/dL (ref 6–20)
Potassium: 4.3 mmol/L (ref 3.3–5.1)
Sodium: 134 mmol/L (ref 133–145)
Total Protein: 6.8 g/dL (ref 6.3–7.7)

## 2012-01-15 LAB — LIPID PANEL
Chol/HDL Ratio: 2.8
Cholesterol: 215 mg/dL — AB
HDL: 76 mg/dL
LDL Calculated: 111 mg/dL
Non HDL Cholesterol: 139 mg/dL
Triglycerides: 142 mg/dL

## 2012-01-15 LAB — CBC AND DIFFERENTIAL
Baso # K/uL: 0 10*3/uL (ref 0.0–0.1)
Basophil %: 0.4 % (ref 0.1–1.2)
Eos # K/uL: 0.2 10*3/uL (ref 0.0–0.4)
Eosinophil %: 2.8 % (ref 0.7–5.8)
Hematocrit: 35 % (ref 34–45)
Hemoglobin: 11.7 g/dL (ref 11.2–15.7)
Lymph # K/uL: 2.6 10*3/uL (ref 1.2–3.7)
Lymphocyte %: 31.6 % (ref 19.3–51.7)
MCV: 95 fL (ref 79–95)
Mono # K/uL: 0.8 10*3/uL (ref 0.2–0.9)
Monocyte %: 9.3 % (ref 4.7–12.5)
Neut # K/uL: 4.6 10*3/uL (ref 1.6–6.1)
Platelets: 254 10*3/uL (ref 160–370)
RBC: 3.7 MIL/uL — ABNORMAL LOW (ref 3.9–5.2)
RDW: 12.5 % (ref 11.7–14.4)
Seg Neut %: 55.9 % (ref 34.0–71.1)
WBC: 8.2 10*3/uL (ref 4.0–10.0)

## 2012-01-15 LAB — SEDIMENTATION RATE, AUTOMATED: Sedimentation Rate: 27 mm/hr (ref 0–30)

## 2012-01-15 LAB — CRP: CRP: 5 mg/L (ref 0–10)

## 2012-01-15 LAB — TSH: TSH: 0.93 u[IU]/mL (ref 0.27–4.20)

## 2012-01-19 LAB — VITAMIN D
25-OH VIT D2: <4 ng/mL
25-OH VIT D3: 52 ng/mL
25-OH Vit Total: 52 ng/mL (ref 30–80)

## 2012-02-19 ENCOUNTER — Other Ambulatory Visit: Payer: Self-pay

## 2012-03-15 ENCOUNTER — Ambulatory Visit
Admit: 2012-03-15 | Discharge: 2012-03-15 | Disposition: A | Discharge status: Home / Self Care | Payer: Self-pay | Source: Ambulatory Visit | Attending: Internal Medicine | Admitting: Internal Medicine

## 2012-03-15 LAB — URINALYSIS WITH REFLEX TO MICROSCOPIC
Blood,UA: NEGATIVE
Ketones, UA: NEGATIVE
Leuk Esterase,UA: NEGATIVE
Nitrite,UA: NEGATIVE
Protein,UA: NEGATIVE mg/dL
Specific Gravity,UA: 1.024 (ref 1.002–1.030)
pH,UA: 6 (ref 5.0–8.0)

## 2012-03-16 LAB — AEROBIC CULTURE: Aerobic Culture: 0

## 2012-06-14 ENCOUNTER — Ambulatory Visit
Admit: 2012-06-14 | Discharge: 2012-06-14 | Disposition: A | Discharge status: Home / Self Care | Payer: Self-pay | Source: Ambulatory Visit | Attending: Internal Medicine | Admitting: Internal Medicine

## 2012-06-14 LAB — URINALYSIS WITH REFLEX TO MICROSCOPIC
Ketones, UA: NEGATIVE
Nitrite,UA: NEGATIVE
Protein,UA: 100 mg/dL — AB
Specific Gravity,UA: 1.02 (ref 1.002–1.030)
pH,UA: 6 (ref 5.0–8.0)

## 2012-06-14 LAB — URINE MICROSCOPIC (IQ200)
Hyaline Casts,UA: 24 /lpf — ABNORMAL HIGH (ref 0–2)
RBC,UA: 5 /hpf — AB (ref 0–2)
WBC,UA: 383 /hpf — ABNORMAL HIGH (ref 0–5)

## 2012-06-16 LAB — AEROBIC CULTURE

## 2012-09-09 ENCOUNTER — Ambulatory Visit
Admit: 2012-09-09 | Discharge: 2012-09-09 | Disposition: A | Discharge status: Home / Self Care | Payer: Self-pay | Source: Ambulatory Visit | Attending: Internal Medicine | Admitting: Internal Medicine

## 2012-09-09 LAB — URINALYSIS WITH REFLEX TO MICROSCOPIC
Blood,UA: NEGATIVE
Ketones, UA: NEGATIVE
Leuk Esterase,UA: NEGATIVE
Nitrite,UA: NEGATIVE
Protein,UA: NEGATIVE mg/dL
Specific Gravity,UA: 1.01 (ref 1.002–1.030)
pH,UA: 7 (ref 5.0–8.0)

## 2012-09-10 LAB — AEROBIC CULTURE: Aerobic Culture: 0

## 2012-09-17 ENCOUNTER — Ambulatory Visit
Admit: 2012-09-17 | Discharge: 2012-09-17 | Disposition: A | Discharge status: Home / Self Care | Payer: Self-pay | Source: Ambulatory Visit | Attending: Internal Medicine | Admitting: Internal Medicine

## 2012-09-17 LAB — URINALYSIS WITH REFLEX TO MICROSCOPIC
Blood,UA: NEGATIVE
Ketones, UA: NEGATIVE
Leuk Esterase,UA: NEGATIVE
Nitrite,UA: NEGATIVE
Protein,UA: NEGATIVE mg/dL
Specific Gravity,UA: 1.015 (ref 1.002–1.030)
pH,UA: 7 (ref 5.0–8.0)

## 2012-09-18 LAB — AEROBIC CULTURE: Aerobic Culture: 0

## 2012-11-16 ENCOUNTER — Encounter: Payer: Self-pay | Admitting: Pulmonology

## 2012-11-16 ENCOUNTER — Ambulatory Visit: Payer: Self-pay | Admitting: Pulmonology

## 2012-11-16 ENCOUNTER — Ambulatory Visit
Admit: 2012-11-16 | Discharge: 2012-11-16 | Disposition: A | Discharge status: Home / Self Care | Payer: Self-pay | Source: Ambulatory Visit | Admitting: Pulmonology

## 2012-11-16 VITALS — BP 122/60 | HR 75 | Ht 65.0 in | Wt 151.2 lb

## 2012-11-16 DIAGNOSIS — R0602 Shortness of breath: Secondary | ICD-10-CM

## 2012-11-16 NOTE — Progress Notes (Signed)
This patient has done well over the last one year.  In the winter, she was coughing more, placed on Advair 250/50 1 inhalation bid which he has continued with improvement of her cough.  She has not had any recurrent lung infections, her activity level has been good.    BP 122/60  Pulse 75  Ht 1.651 m (5\' 5" )  Wt 68.584 kg (151 lb 3.2 oz)  BMI 25.16 kg/m2   The thyroid is normal and there are no masses in the neck.   Breath sounds are equal bilaterally and are clear to auscultation.  The chest is normal to percussion.  No crackles and wheezes were heard on auscultation. The patient is in normal sinus rhythm.  There is no S3, S4 or murmur heard.  Carotid pulses are equal bilaterally.  No carotid bruits are heard. There is no clubbing, cyanosis or edema of the extremities.    Repeat spirometry today shows improvement compared to that of one year ago, such that FEV1 and FVC are now at 69 and 72% of predicted.  It appears perhaps the Advair has been efficacious.    I have not made any changes in medications, at this point she will followup with her primary care physician unless she has other difficulties.

## 2012-11-16 NOTE — Procedures (Signed)
Mild reduction in FEV1 and FVC

## 2013-01-14 ENCOUNTER — Ambulatory Visit
Admit: 2013-01-14 | Discharge: 2013-01-14 | Disposition: A | Discharge status: Home / Self Care | Payer: Self-pay | Source: Ambulatory Visit | Attending: Internal Medicine | Admitting: Internal Medicine

## 2013-01-17 LAB — HERPES SIMPLEX VIRUS TYPE 1AND/OR 2 IGM BY ELISA: HSV I/II Comb IgM: 0.41 IV (ref <=0.89)

## 2013-01-17 LAB — HSV 1 ANTIBODY, IGG: HSV 1 IgG: 0.42 IV

## 2013-01-17 LAB — HSV 2 ANTIBODY, IGG: HSV 2 IgG: 0.18 IV

## 2013-05-31 ENCOUNTER — Ambulatory Visit: Payer: Self-pay | Admitting: Pulmonology

## 2013-05-31 ENCOUNTER — Ambulatory Visit
Admit: 2013-05-31 | Discharge: 2013-05-31 | Disposition: A | Discharge status: Home / Self Care | Payer: Self-pay | Source: Ambulatory Visit | Admitting: Pulmonology

## 2013-05-31 ENCOUNTER — Other Ambulatory Visit: Payer: Self-pay | Admitting: Pulmonology

## 2013-05-31 ENCOUNTER — Ambulatory Visit
Admit: 2013-05-31 | Discharge: 2013-05-31 | Disposition: A | Discharge status: Home / Self Care | Payer: Self-pay | Source: Ambulatory Visit | Attending: Pulmonology | Admitting: Pulmonology

## 2013-05-31 VITALS — BP 128/80 | HR 99 | Temp 98.4°F | Resp 14

## 2013-05-31 DIAGNOSIS — R062 Wheezing: Secondary | ICD-10-CM

## 2013-05-31 MED ORDER — CEFUROXIME AXETIL 250 MG/5ML PO SUSR *A*
250.0000 mg | Freq: Two times a day (BID) | ORAL | Status: DC
Start: 2013-05-31 — End: 2014-08-22

## 2013-05-31 NOTE — Procedures (Signed)
Moderate airway obstruction, no change after bronchodilator

## 2013-05-31 NOTE — Progress Notes (Signed)
This patient has for an urgent appointment as she is leaving for [place] in one week.  She has been prone to respiratory tract infections in the past and had a previous empyema.  In the last several days she has a productive cough and yellow brown sputum.  She also has some degree of asthma and has been only using her Advair intermittently and she had many questions which we answered today about the proper way to take it.    BP 128/80  Pulse 99  Temp(Src) 36.9 C (98.4 F)  Resp 14  SpO2 96%   The thyroid is normal and there are no masses in the neck.   Breath sounds are equal bilaterally and are clear to auscultation.  The chest is normal to percussion.  No crackles and wheezes were heard on auscultation. The patient is in normal sinus rhythm.  There is no S3, S4 or murmur heard.  Carotid pulses are equal bilaterally.  No carotid bruits are heard. There is no clubbing, cyanosis or edema of the extremities.    I performed a chest radiograph today, and thought it showed a slight infiltrate adjacent to the left heart border so I have given her a course of Augmentin.  Lung function looks stable with no change after bronchodilator but still airway obstruction.    I will let her primary care physician decide, but perhaps Inderal could be discontinued or changed to a more selective agent in case asthma is playing a role.  Otherwise, I think she is in good shape for her trip to [place].  I reviewed her chest radiographs and my plans with her.

## 2013-05-31 NOTE — Patient Instructions (Signed)
Take cefuroxime 5 ml or 1 teaspoon twice a day for 10 days

## 2013-08-26 ENCOUNTER — Ambulatory Visit
Admit: 2013-08-26 | Discharge: 2013-08-26 | Disposition: A | Discharge status: Home / Self Care | Payer: Self-pay | Source: Ambulatory Visit | Attending: Internal Medicine | Admitting: Internal Medicine

## 2013-08-26 LAB — COMPREHENSIVE METABOLIC PANEL
ALT: 15 U/L (ref 0–35)
AST: 20 U/L (ref 0–35)
Albumin: 3.7 g/dL (ref 3.5–5.2)
Alk Phos: 44 U/L (ref 35–105)
Anion Gap: 11 (ref 7–16)
Bilirubin,Total: 0.5 mg/dL (ref 0.0–1.2)
CO2: 25 mmol/L (ref 20–28)
Calcium: 9 mg/dL (ref 8.6–10.2)
Chloride: 103 mmol/L (ref 96–108)
Creatinine: 0.93 mg/dL (ref 0.51–0.95)
GFR,Black: 65 *
GFR,Caucasian: 56 * — AB
Glucose: 87 mg/dL (ref 60–99)
Lab: 18 mg/dL (ref 6–20)
Potassium: 4.7 mmol/L (ref 3.3–5.1)
Sodium: 139 mmol/L (ref 133–145)
Total Protein: 6.5 g/dL (ref 6.3–7.7)

## 2013-08-26 LAB — TSH: TSH: 1.45 u[IU]/mL (ref 0.27–4.20)

## 2013-08-26 LAB — LIPID PANEL
Chol/HDL Ratio: 2.6
Cholesterol: 211 mg/dL — AB
HDL: 80 mg/dL
LDL Calculated: 101 mg/dL
Non HDL Cholesterol: 131 mg/dL
Triglycerides: 150 mg/dL — AB

## 2013-08-26 LAB — CBC AND DIFFERENTIAL
Baso # K/uL: 0 10*3/uL (ref 0.0–0.1)
Basophil %: 0.5 % (ref 0.1–1.2)
Eos # K/uL: 0.3 10*3/uL (ref 0.0–0.4)
Eosinophil %: 3.9 % (ref 0.7–5.8)
Hematocrit: 36 % (ref 34–45)
Hemoglobin: 11.5 g/dL (ref 11.2–15.7)
Lymph # K/uL: 2.5 10*3/uL (ref 1.2–3.7)
Lymphocyte %: 31.6 % (ref 19.3–51.7)
MCH: 31 pg/cell (ref 26–32)
MCHC: 32 g/dL (ref 32–36)
MCV: 99 fL — ABNORMAL HIGH (ref 79–95)
Mono # K/uL: 0.8 10*3/uL (ref 0.2–0.9)
Monocyte %: 9.8 % (ref 4.7–12.5)
Neut # K/uL: 4.3 10*3/uL (ref 1.6–6.1)
Platelets: 321 10*3/uL (ref 160–370)
RBC: 3.7 MIL/uL — ABNORMAL LOW (ref 3.9–5.2)
RDW: 13 % (ref 11.7–14.4)
Seg Neut %: 54.2 % (ref 34.0–71.1)
WBC: 8 10*3/uL (ref 4.0–10.0)

## 2013-08-26 LAB — VITAMIN B12: Vitamin B12: 990 pg/mL — ABNORMAL HIGH (ref 211–946)

## 2013-08-26 LAB — FOLATE: Folate: >20 ng/mL (ref >=4.6)

## 2013-08-27 LAB — HEMOGLOBIN A1C: Hemoglobin A1C: 5.3 % (ref 4.0–6.0)

## 2013-08-29 LAB — VITAMIN D
25-OH VIT D2: <4 ng/mL
25-OH VIT D3: 48 ng/mL
25-OH Vit Total: 48 ng/mL (ref 30–60)

## 2014-07-18 ENCOUNTER — Ambulatory Visit
Admit: 2014-07-18 | Discharge: 2014-07-18 | Disposition: A | Discharge status: Home / Self Care | Payer: Self-pay | Source: Ambulatory Visit | Attending: Internal Medicine | Admitting: Internal Medicine

## 2014-07-18 LAB — URINALYSIS WITH REFLEX TO MICROSCOPIC
Blood,UA: NEGATIVE
Ketones, UA: NEGATIVE
Leuk Esterase,UA: NEGATIVE
Nitrite,UA: NEGATIVE
Protein,UA: NEGATIVE mg/dL
Specific Gravity,UA: 1.013 (ref 1.002–1.030)
pH,UA: 7 (ref 5.0–8.0)

## 2014-07-25 LAB — AEROBIC CULTURE

## 2014-08-22 ENCOUNTER — Ambulatory Visit: Payer: Self-pay | Admitting: Pulmonology

## 2014-08-22 ENCOUNTER — Encounter: Payer: Self-pay | Admitting: Pulmonology

## 2014-08-22 ENCOUNTER — Ambulatory Visit
Admit: 2014-08-22 | Discharge: 2014-08-22 | Disposition: A | Discharge status: Home / Self Care | Payer: Self-pay | Source: Ambulatory Visit | Attending: Pulmonology | Admitting: Pulmonology

## 2014-08-22 VITALS — BP 132/70 | HR 75 | Ht 64.5 in | Wt 134.8 lb

## 2014-08-22 DIAGNOSIS — J4550 Severe persistent asthma, uncomplicated: Secondary | ICD-10-CM

## 2014-08-22 NOTE — Progress Notes (Signed)
Patient feels she's been att a risk for falls for about 3 weeks due to weakness. Falls risk prevention information given to her and her family. Patient will cal her PCP to discuss this change.

## 2014-08-22 NOTE — Progress Notes (Signed)
This patient comes with about a three-week illness with cough, and a change in sputum color.  She does find her respirations are aggravated with exposure to dust or newspaper.  She has been on Advair regularly but recently this was [facility] by her primary care physician so as to provide her samples.  She is not having any wheezing.  She is somewhat depressed as she intends to be leaving the state for [place] in the near future.  She has been losing weight.  Current Outpatient Prescriptions   Medication    Chlorpheniramine Tannate 12 MG TABS    fluticasone-vilanterol (BREO ELLIPTA) 100-25 MCG/INH inhaler    PREDNISOLONE ACETATE OP    fluticasone-salmeterol (ADVAIR DISKUS) 250-50 MCG/DOSE diskus inhaler    nitroGLYCERIN (NITROSTAT) 0.4 MG SL tablet    losartan (COZAAR) 50 MG tablet    guaiFENesin-codeine (CHERATUSSIN AC) 100-10 MG/5ML liquid    propranolol (INDERAL) 20 MG tablet    estrogens, conjugated, (PREMARIN) 0.625 MG tablet    Multiple Vitamin (MULTIVITAMIN) per tablet    aspirin 81 MG tablet    Calcium Carbonate-Vitamin D 600-200 MG-UNIT TABS    LORazepam (ATIVAN) 0.5 MG tablet     No current facility-administered medications for this visit.     She has lost her appetite since taking the codeine cough syrup.      BP 132/70 mmHg   Pulse 75   Ht 1.638 m (5' 4.5")   Wt 61.145 kg (134 lb 12.8 oz)   BMI 22.79 kg/m2  The thyroid is normal and there are no masses in the neck.   Breath sounds are equal bilaterally and are clear to auscultation.  The chest is normal to percussion.  No crackles and wheezes were heard on auscultation. The patient is in normal sinus rhythm.  There is no S3, S4 or murmur heard.  Carotid pulses are equal bilaterally.  No carotid bruits are heard. There is no clubbing, cyanosis or edema of the extremities.    She certainly sounds as if she has a exacerbation of her bronchiectasis.  She will undergo a chest radiograph today, collect a sputum tomorrow and then start  Augmentin for 10 days.  Hopefully this will ameliorate her symptoms and improve her cough.

## 2014-08-22 NOTE — Patient Instructions (Signed)
Collect a sample and take it to [place]    Start Augmentin 1 pill twice a day    Get chest xray today

## 2014-08-23 ENCOUNTER — Ambulatory Visit
Admit: 2014-08-23 | Discharge: 2014-08-23 | Disposition: A | Discharge status: Home / Self Care | Payer: Self-pay | Source: Ambulatory Visit | Attending: Pulmonology | Admitting: Pulmonology

## 2014-08-23 LAB — GRAM STAIN

## 2014-08-24 ENCOUNTER — Ambulatory Visit
Admit: 2014-08-24 | Discharge: 2014-08-24 | Disposition: A | Discharge status: Home / Self Care | Payer: Self-pay | Source: Ambulatory Visit | Attending: Internal Medicine | Admitting: Internal Medicine

## 2014-08-24 ENCOUNTER — Other Ambulatory Visit: Payer: Self-pay | Admitting: Pulmonology

## 2014-08-24 LAB — CBC AND DIFFERENTIAL
Baso # K/uL: 0.1 10*3/uL (ref 0.0–0.1)
Basophil %: 0.5 %
Eos # K/uL: 0.2 10*3/uL (ref 0.0–0.4)
Eosinophil %: 1.4 %
Hematocrit: 34 % (ref 34–45)
Hemoglobin: 11.1 g/dL — ABNORMAL LOW (ref 11.2–15.7)
IMM Granulocytes #: 0.1 10*3/uL (ref 0.0–0.1)
IMM Granulocytes: 0.6 %
Lymph # K/uL: 1.9 10*3/uL (ref 1.2–3.7)
Lymphocyte %: 15.2 %
MCH: 32 pg/cell (ref 26–32)
MCHC: 33 g/dL (ref 32–36)
MCV: 96 fL — ABNORMAL HIGH (ref 79–95)
Mono # K/uL: 1.2 10*3/uL — ABNORMAL HIGH (ref 0.2–0.9)
Monocyte %: 9.6 %
Neut # K/uL: 9.2 10*3/uL — ABNORMAL HIGH (ref 1.6–6.1)
Nucl RBC # K/uL: 0 10*3/uL (ref 0.0–0.0)
Nucl RBC %: 0 /100 WBC (ref 0.0–0.2)
Platelets: 543 10*3/uL — ABNORMAL HIGH (ref 160–370)
RBC: 3.5 MIL/uL — ABNORMAL LOW (ref 3.9–5.2)
RDW: 11.9 % (ref 11.7–14.4)
Seg Neut %: 72.7 %
WBC: 12.7 10*3/uL — ABNORMAL HIGH (ref 4.0–10.0)

## 2014-08-24 LAB — COMPREHENSIVE METABOLIC PANEL
ALT: 10 U/L (ref 0–35)
AST: 18 U/L (ref 0–35)
Albumin: 3.5 g/dL (ref 3.5–5.2)
Alk Phos: 86 U/L (ref 35–105)
Anion Gap: 15 (ref 7–16)
Bilirubin,Total: 0.2 mg/dL (ref 0.0–1.2)
CO2: 22 mmol/L (ref 20–28)
Calcium: 9.3 mg/dL (ref 8.6–10.2)
Chloride: 95 mmol/L — ABNORMAL LOW (ref 96–108)
Creatinine: 0.86 mg/dL (ref 0.51–0.95)
GFR,Black: 71 *
GFR,Caucasian: 62 *
Glucose: 143 mg/dL — ABNORMAL HIGH (ref 60–99)
Lab: 17 mg/dL (ref 6–20)
Potassium: 4.7 mmol/L (ref 3.3–5.1)
Sodium: 132 mmol/L — ABNORMAL LOW (ref 133–145)
Total Protein: 7.2 g/dL (ref 6.3–7.7)

## 2014-08-24 LAB — FOLATE: Folate: >20 ng/mL (ref >=4.6)

## 2014-08-24 LAB — VITAMIN B12: Vitamin B12: 1174 pg/mL — ABNORMAL HIGH (ref 211–946)

## 2014-08-24 LAB — SEDIMENTATION RATE, AUTOMATED: Sedimentation Rate: 87 mm/hr — ABNORMAL HIGH (ref 0–30)

## 2014-08-24 LAB — FERRITIN: Ferritin: 282 ng/mL — ABNORMAL HIGH (ref 10–120)

## 2014-08-24 LAB — TSH: TSH: 0.98 u[IU]/mL (ref 0.27–4.20)

## 2014-08-24 LAB — CRP: CRP: 55 mg/L — ABNORMAL HIGH (ref 0–10)

## 2014-08-24 MED ORDER — AMOXICILLIN-POT CLAVULANATE 500-125 MG PO TABS *I*
1.0000 | ORAL_TABLET | Freq: Three times a day (TID) | ORAL | Status: DC
Start: 2014-08-24 — End: 2014-08-25

## 2014-08-25 ENCOUNTER — Other Ambulatory Visit: Payer: Self-pay | Admitting: Pulmonology

## 2014-08-25 DIAGNOSIS — J90 Pleural effusion, not elsewhere classified: Secondary | ICD-10-CM

## 2014-08-25 LAB — HEMOGLOBIN A1C: Hemoglobin A1C: 5.8 % (ref 4.0–6.0)

## 2014-08-25 MED ORDER — AMOXICILLIN-POT CLAVULANATE 500-125 MG PO TABS *I*
1.0000 | ORAL_TABLET | Freq: Two times a day (BID) | ORAL | Status: DC
Start: 2014-08-25 — End: 2014-09-13

## 2014-08-26 LAB — AEROBIC CULTURE

## 2014-08-29 ENCOUNTER — Ambulatory Visit: Payer: Self-pay | Admitting: Pulmonology

## 2014-08-29 LAB — VITAMIN D
25-OH VIT D2: <4 ng/mL
25-OH VIT D3: 42 ng/mL
25-OH Vit Total: 42 ng/mL (ref 30–60)

## 2014-09-01 LAB — LEGIONELLA CULTURE: Legionella Culture: 0

## 2014-09-13 ENCOUNTER — Encounter: Payer: Self-pay | Admitting: Pulmonology

## 2014-09-13 ENCOUNTER — Ambulatory Visit
Admit: 2014-09-13 | Discharge: 2014-09-13 | Disposition: A | Discharge status: Home / Self Care | Payer: Self-pay | Source: Ambulatory Visit | Attending: Pulmonology | Admitting: Pulmonology

## 2014-09-13 ENCOUNTER — Ambulatory Visit: Payer: Self-pay | Admitting: Pulmonology

## 2014-09-13 VITALS — BP 132/74 | HR 68 | Ht 64.0 in | Wt 134.0 lb

## 2014-09-13 DIAGNOSIS — J8489 Other specified interstitial pulmonary diseases: Secondary | ICD-10-CM

## 2014-09-13 MED ORDER — PREDNISONE 10 MG PO TABS *I*
ORAL_TABLET | ORAL | Status: AC
Start: 2014-09-13 — End: ?

## 2014-09-13 MED ORDER — ALENDRONATE SODIUM 70 MG PO TABS *I*
70.0000 mg | ORAL_TABLET | ORAL | Status: AC
Start: 2014-09-13 — End: ?

## 2014-09-13 NOTE — Patient Instructions (Addendum)
Start prednisone 2 tabs daily    Start aledronate  1 tab weekly on empty stomach    Return here in 3 weeks    Stop Advair and Breo

## 2014-09-14 NOTE — Progress Notes (Signed)
This patient recently came and had signs of infection and for this was treated.  She grew out Staphylococcus aureus that was very sensitive to most antibiotics.  She has responded to treatment but did undergo a chest radiographs that showed increased density at the right base.  That prompted a CT scan.  She has lost some weight, her appetite is poor.  She has no fever or chills.  She has not been short of breath.  There has been no chest pain.    Today I reviewed the CT scan done this morning and it shows a consolidation in the right lower lobe.  There were also some mild patchy airspace densities on the left side.    From the appearance on the CT scan I am most concerned that she has an organizing pneumonia and I explained that entity to her.  Because of this, I am going to place her on 20 mg of prednisone a day for the next 3 weeks and then she will return here and obtain another chest radiographs.  She is to be moving in the near future to [place] and further workup may need to be done at that location.  If she does not respond to prednisone, then a biopsy may be necessary as the next step even though she is at low risk for malignancy.  This entire visit was devoted to counseling regarding this problem and my recommendations.  This lasted 25 minutes.  I also added alendronate 1 tablet weekly 70 mg to avoid osteoporosis with the corticosteroids.

## 2014-09-27 ENCOUNTER — Ambulatory Visit
Admit: 2014-09-27 | Discharge: 2014-09-27 | Disposition: A | Discharge status: Home / Self Care | Payer: Self-pay | Source: Ambulatory Visit | Attending: Internal Medicine | Admitting: Internal Medicine

## 2014-09-27 ENCOUNTER — Ambulatory Visit
Admit: 2014-09-27 | Discharge: 2014-09-27 | Disposition: A | Discharge status: Home / Self Care | Payer: Self-pay | Source: Ambulatory Visit | Admitting: Internal Medicine

## 2014-09-27 LAB — CBC AND DIFFERENTIAL
Baso # K/uL: 0 10*3/uL (ref 0.0–0.1)
Basophil %: 0 %
Eos # K/uL: 0 10*3/uL (ref 0.0–0.4)
Eosinophil %: 0 %
Hematocrit: 38 % (ref 34–45)
Hemoglobin: 12 g/dL (ref 11.2–15.7)
Lymph # K/uL: 4 10*3/uL — ABNORMAL HIGH (ref 1.2–3.7)
Lymphocyte %: 13 %
MCH: 31 pg/cell (ref 26–32)
MCHC: 32 g/dL (ref 32–36)
MCV: 96 fL — ABNORMAL HIGH (ref 79–95)
Mono # K/uL: 1.6 10*3/uL — ABNORMAL HIGH (ref 0.2–0.9)
Monocyte %: 6 %
Neut # K/uL: 21 10*3/uL — ABNORMAL HIGH (ref 1.6–6.1)
Nucl RBC # K/uL: 0 10*3/uL (ref 0.0–0.0)
Nucl RBC %: 0 /100 WBC (ref 0.0–0.2)
Platelets: 404 10*3/uL — ABNORMAL HIGH (ref 160–370)
RBC: 3.9 MIL/uL (ref 3.9–5.2)
RDW: 14.3 % (ref 11.7–14.4)
Seg Neut %: 79 %
WBC: 26.6 10*3/uL — ABNORMAL HIGH (ref 4.0–10.0)

## 2014-09-27 LAB — COMPREHENSIVE METABOLIC PANEL
ALT: 24 U/L (ref 0–35)
AST: 23 U/L (ref 0–35)
Albumin: 3.6 g/dL (ref 3.5–5.2)
Alk Phos: 69 U/L (ref 35–105)
Anion Gap: 20 — ABNORMAL HIGH (ref 7–16)
Bilirubin,Total: 0.5 mg/dL (ref 0.0–1.2)
CO2: 21 mmol/L (ref 20–28)
Calcium: 9.5 mg/dL (ref 8.6–10.2)
Chloride: 98 mmol/L (ref 96–108)
Creatinine: 1.22 mg/dL — ABNORMAL HIGH (ref 0.51–0.95)
GFR,Black: 47 * — AB
GFR,Caucasian: 40 * — AB
Glucose: 79 mg/dL (ref 60–99)
Lab: 32 mg/dL — ABNORMAL HIGH (ref 6–20)
Potassium: 4.3 mmol/L (ref 3.3–5.1)
Sodium: 139 mmol/L (ref 133–145)
Total Protein: 7.1 g/dL (ref 6.3–7.7)

## 2014-09-27 LAB — DIFF MANUAL
Diff Based On: 100 CELLS
React Lymph %: 2 % (ref 0–6)

## 2014-09-27 LAB — GRAM STAIN

## 2014-09-28 LAB — URINALYSIS WITH MICROSCOPIC
Blood,UA: NEGATIVE
Hyaline Casts,UA: 3 /lpf — AB (ref 0–2)
Ketones, UA: NEGATIVE
Nitrite,UA: POSITIVE — AB
Protein,UA: 30 mg/dL — AB
RBC,UA: 4 /hpf — AB (ref 0–2)
Specific Gravity,UA: 1.019 (ref 1.002–1.030)
WBC,UA: 96 /hpf — ABNORMAL HIGH (ref 0–5)
pH,UA: 6 (ref 5.0–8.0)

## 2014-09-29 LAB — AEROBIC CULTURE

## 2014-10-03 ENCOUNTER — Encounter: Payer: Self-pay | Admitting: Pulmonology

## 2014-10-03 ENCOUNTER — Ambulatory Visit
Admit: 2014-10-03 | Discharge: 2014-10-03 | Disposition: A | Discharge status: Home / Self Care | Payer: Self-pay | Source: Ambulatory Visit | Attending: Pulmonology | Admitting: Pulmonology

## 2014-10-03 ENCOUNTER — Ambulatory Visit: Payer: Self-pay | Admitting: Pulmonology

## 2014-10-03 VITALS — Ht 64.0 in | Wt 134.0 lb

## 2014-10-03 DIAGNOSIS — J8489 Other specified interstitial pulmonary diseases: Secondary | ICD-10-CM

## 2014-10-03 MED ORDER — GUAIFENESIN-CODEINE 100-10 MG/5ML PO SYRP *I*
ORAL_SOLUTION | ORAL | Status: AC
Start: 2014-10-03 — End: ?

## 2014-10-03 NOTE — Progress Notes (Signed)
This patient has been on approximately 20 days of prednisone 20 mg a day and with this her cough has improved.  She did undergo a white count that was elevated but this could be steroid induced, and her primary care physician is treating her for urinary/respiratory infection with Bactrim.  She is going to start that medication now.  She generally feels well and has had no relapse in symptoms.  She is taking a codeine-containing cough syrup at night.  She will be moving in the very near future.    Her lungs were clear bilaterally, heart in regular rhythm.  Abdomen is unremarkable.    I reviewed a chest radiograph today shows improvement in the density in the right lower lobe.    This does appear to be a focal organizing pneumonia that is improving on steroids.  She will take 10 mg a day for another 10 days and then discontinue the corticosteroids.  She was hesitant in taking alendronate because of esophageal spasm problems in the past.  I also prescribed the codeine cough medication and she is taking 1 teaspoon or less at bedtime for controlling the cough.  I did not make any followup arrangements to see her.

## 2014-10-03 NOTE — Patient Instructions (Signed)
Prednisone 1 pill a day for 10 days, then stop    Start Bactrim    Cough medication at night    Clinical impression,  LLL organizing pneumonia, responsive to prednisone with improvement    Need chest xray in 6-8 weeks

## 2014-10-06 LAB — LEGIONELLA CULTURE: Legionella Culture: 0

## 2014-12-12 ENCOUNTER — Ambulatory Visit: Payer: Self-pay | Admitting: Internal Medicine

## 2016-04-07 ENCOUNTER — Ambulatory Visit: Payer: Medicare Other

## 2016-04-07 ENCOUNTER — Ambulatory Visit
Admission: EM | Admit: 2016-04-07 | Discharge: 2016-04-07 | Disposition: A | Discharge status: Home / Self Care | Payer: Medicare Other | Attending: Family Medicine | Admitting: Family Medicine

## 2016-04-07 ENCOUNTER — Encounter: Payer: Self-pay | Admitting: Emergency Medicine

## 2016-04-07 DIAGNOSIS — M533 Sacrococcygeal disorders, not elsewhere classified: Secondary | ICD-10-CM | POA: Insufficient documentation

## 2016-04-07 DIAGNOSIS — X58XXXA Exposure to other specified factors, initial encounter: Secondary | ICD-10-CM | POA: Insufficient documentation

## 2016-04-07 DIAGNOSIS — Y929 Unspecified place or not applicable: Secondary | ICD-10-CM | POA: Insufficient documentation

## 2016-04-07 DIAGNOSIS — Z90710 Acquired absence of both cervix and uterus: Secondary | ICD-10-CM | POA: Insufficient documentation

## 2016-04-07 DIAGNOSIS — Z7982 Long term (current) use of aspirin: Secondary | ICD-10-CM | POA: Insufficient documentation

## 2016-04-07 DIAGNOSIS — S86911A Strain of unspecified muscle(s) and tendon(s) at lower leg level, right leg, initial encounter: Secondary | ICD-10-CM | POA: Diagnosis not present

## 2016-04-07 DIAGNOSIS — Z79899 Other long term (current) drug therapy: Secondary | ICD-10-CM | POA: Insufficient documentation

## 2016-04-07 DIAGNOSIS — Y939 Activity, unspecified: Secondary | ICD-10-CM | POA: Diagnosis not present

## 2016-04-07 DIAGNOSIS — I10 Essential (primary) hypertension: Secondary | ICD-10-CM | POA: Insufficient documentation

## 2016-04-07 HISTORY — DX: Essential (primary) hypertension: I10

## 2016-04-07 NOTE — ED Provider Notes (Signed)
[facility]    CSN: [id number] Arrival date & time: 04/07/16  0910     History   Chief Complaint Chief Complaint  Patient presents with  . Tailbone Pain    HPI Cathy Juarez is a 82 y.o. female.   82 yo female with a c/o tailbone pain radiating down the right leg. Denies any falls, direct trauma, fevers, chills, rash.    The history is provided by the patient.    Past Medical History:  Diagnosis Date  . Hypertension     There are no active problems to display for this patient.   Past Surgical History:  Procedure Laterality Date  . ABDOMINAL HYSTERECTOMY      OB History    No data available       Home Medications    Prior to Admission medications   Medication Sig Start Date End Date Taking? Authorizing Provider  aspirin EC 81 MG tablet Take 81 mg by mouth daily.   Yes Historical Provider, MD  azithromycin (ZITHROMAX) 250 MG tablet Take 250 mg by mouth once a week.   Yes Historical Provider, MD  loratadine (CLARITIN) 10 MG tablet Take 10 mg by mouth daily.   Yes Historical Provider, MD  losartan (COZAAR) 25 MG tablet Take by mouth daily.   Yes Historical Provider, MD  propranolol (INDERAL) 20 MG tablet Take 20 mg by mouth 2 (two) times daily.   Yes Historical Provider, MD    Family History History reviewed. No pertinent family history.  Social History Social History  Substance Use Topics  . Smoking status: Never Smoker  . Smokeless tobacco: Never Used  . Alcohol use No     Allergies   Review of patient's allergies indicates no known allergies.   Review of Systems Review of Systems   Physical Exam Triage Vital Signs ED Triage Vitals  Enc Vitals Group     BP 04/07/16 0946 (!) 148/63     Pulse Rate 04/07/16 0946 76     Resp 04/07/16 0946 16     Temp 04/07/16 0946 97.7 F (36.5 C)     Temp Source 04/07/16 0946 Oral     SpO2 04/07/16 0946 99 %     Weight 04/07/16 0946 124 lb (56.2 kg)     Height 04/07/16 0946 5\' 6"  (1.676  m)     Head Circumference --      Peak Flow --      Pain Score 04/07/16 0949 6     Pain Loc --      Pain Edu? --      Excl. in [place]? --    No data found.   Updated Vital Signs BP (!) 148/63 (BP Location: Right Arm)   Pulse 76   Temp 97.7 F (36.5 C) (Oral)   Resp 16   Ht 5\' 6"  (1.676 m)   Wt 124 lb (56.2 kg)   SpO2 99%   BMI 20.01 kg/m   Visual Acuity Right Eye Distance:   Left Eye Distance:   Bilateral Distance:    Right Eye Near:   Left Eye Near:    Bilateral Near:     Physical Exam  Constitutional: She appears well-developed and well-nourished. No distress.  Musculoskeletal:  Tenderness to palpation over sacrum; no skin lesions or erythema; normal (baseline) range of motion for age  Skin: She is not diaphoretic.  Nursing note and vitals reviewed.    UC Treatments / Results  Labs (all labs ordered are listed,  but only abnormal results are displayed) Labs Reviewed - No data to display  EKG  EKG Interpretation None       Radiology Dg Pelvis 1-2 Views  Result Date: 04/07/2016 CLINICAL DATA:  Acute pelvic pain without known injury. EXAM: PELVIS - 1-2 VIEW COMPARISON:  None. FINDINGS: There is no evidence of pelvic fracture or diastasis. No pelvic bone lesions are seen. Hip and sacroiliac joints appear normal. IMPRESSION: No acute abnormality seen in the pelvis. Electronically Signed   By: Marijo Conception, M.D.   On: 04/07/2016 11:45   Dg Sacrum/coccyx  Result Date: 04/07/2016 CLINICAL DATA:  Acute tailbone and right lower extremity pain. No known injury. EXAM: SACRUM AND COCCYX - 2+ VIEW COMPARISON:  None. FINDINGS: There is no evidence of fracture or other focal bone lesions. IMPRESSION: No acute abnormality seen in the sacrum or coccyx. Electronically Signed   By: Marijo Conception, M.D.   On: 04/07/2016 11:43    Procedures Procedures (including critical care time)  Medications Ordered in UC Medications - No data to display   Initial Impression /  Assessment and Plan / UC Course  I have reviewed the triage vital signs and the nursing notes.  Pertinent labs & imaging results that were available during my care of the patient were reviewed by me and considered in my medical decision making (see chart for details).  Clinical Course      Final Clinical Impressions(s) / UC Diagnoses   Final diagnoses:  Muscle strain of right lower leg, initial encounter    New Prescriptions New Prescriptions   No medications on file   1. x-ray results (negative for acute bony abnormality) and diagnosis reviewed with patient and family 2.Recommend supportive treatment with rest, otc acetaminophen prn 3. Follow-up prn if symptoms worsen or don't improve   Norval Gable, MD 04/07/16 1214

## 2016-04-07 NOTE — Discharge Instructions (Signed)
Rest and tylenol as needed

## 2016-04-07 NOTE — ED Triage Notes (Signed)
Patient c/o tailbone pain that goes into her right hip for the past couple days.  Patient denies fall or injury.

## 2016-05-04 ENCOUNTER — Encounter: Payer: Self-pay | Admitting: Emergency Medicine

## 2016-05-04 ENCOUNTER — Observation Stay: Payer: Medicare Other

## 2016-05-04 ENCOUNTER — Emergency Department: Payer: Medicare Other

## 2016-05-04 ENCOUNTER — Observation Stay
Admission: EM | Admit: 2016-05-04 | Discharge: 2016-05-07 | Payer: Medicare Other | Attending: Internal Medicine | Admitting: Internal Medicine

## 2016-05-04 DIAGNOSIS — R531 Weakness: Secondary | ICD-10-CM | POA: Insufficient documentation

## 2016-05-04 DIAGNOSIS — R2681 Unsteadiness on feet: Secondary | ICD-10-CM

## 2016-05-04 DIAGNOSIS — G9389 Other specified disorders of brain: Secondary | ICD-10-CM | POA: Insufficient documentation

## 2016-05-04 DIAGNOSIS — R42 Dizziness and giddiness: Secondary | ICD-10-CM

## 2016-05-04 DIAGNOSIS — M25511 Pain in right shoulder: Secondary | ICD-10-CM | POA: Insufficient documentation

## 2016-05-04 DIAGNOSIS — N390 Urinary tract infection, site not specified: Secondary | ICD-10-CM | POA: Diagnosis not present

## 2016-05-04 DIAGNOSIS — R05 Cough: Secondary | ICD-10-CM | POA: Diagnosis not present

## 2016-05-04 DIAGNOSIS — R55 Syncope and collapse: Secondary | ICD-10-CM | POA: Diagnosis not present

## 2016-05-04 DIAGNOSIS — D480 Neoplasm of uncertain behavior of bone and articular cartilage: Secondary | ICD-10-CM | POA: Insufficient documentation

## 2016-05-04 DIAGNOSIS — Z7982 Long term (current) use of aspirin: Secondary | ICD-10-CM | POA: Diagnosis not present

## 2016-05-04 DIAGNOSIS — M19011 Primary osteoarthritis, right shoulder: Secondary | ICD-10-CM | POA: Diagnosis not present

## 2016-05-04 DIAGNOSIS — I10 Essential (primary) hypertension: Secondary | ICD-10-CM | POA: Diagnosis not present

## 2016-05-04 DIAGNOSIS — Z90710 Acquired absence of both cervix and uterus: Secondary | ICD-10-CM | POA: Insufficient documentation

## 2016-05-04 DIAGNOSIS — B9620 Unspecified Escherichia coli [E. coli] as the cause of diseases classified elsewhere: Secondary | ICD-10-CM | POA: Insufficient documentation

## 2016-05-04 DIAGNOSIS — M6281 Muscle weakness (generalized): Secondary | ICD-10-CM

## 2016-05-04 LAB — COMPREHENSIVE METABOLIC PANEL
ALBUMIN: 3.7 g/dL (ref 3.5–5.0)
ALK PHOS: 60 U/L (ref 38–126)
ALT: 10 U/L — ABNORMAL LOW (ref 14–54)
ANION GAP: 7 (ref 5–15)
AST: 23 U/L (ref 15–41)
BILIRUBIN TOTAL: 0.9 mg/dL (ref 0.3–1.2)
BUN: 30 mg/dL — AB (ref 6–20)
CALCIUM: 9.3 mg/dL (ref 8.9–10.3)
CO2: 26 mmol/L (ref 22–32)
Chloride: 103 mmol/L (ref 101–111)
Creatinine, Ser: 1.08 mg/dL — ABNORMAL HIGH (ref 0.44–1.00)
GFR calc Af Amer: 52 mL/min — ABNORMAL LOW (ref >60)
GFR calc non Af Amer: 45 mL/min — ABNORMAL LOW (ref >60)
GLUCOSE: 105 mg/dL — AB (ref 65–99)
POTASSIUM: 3.9 mmol/L (ref 3.5–5.1)
SODIUM: 136 mmol/L (ref 135–145)
Total Protein: 7.3 g/dL (ref 6.5–8.1)

## 2016-05-04 LAB — URINALYSIS COMPLETE WITH MICROSCOPIC (ARMC ONLY)
Bilirubin Urine: NEGATIVE
Glucose, UA: NEGATIVE mg/dL
Ketones, ur: NEGATIVE mg/dL
Nitrite: NEGATIVE
PROTEIN: 30 mg/dL — AB
SPECIFIC GRAVITY, URINE: 1.018 (ref 1.005–1.030)
pH: 5 (ref 5.0–8.0)

## 2016-05-04 LAB — CBC WITH DIFFERENTIAL/PLATELET
Basophils Absolute: 0.1 10*3/uL (ref 0–0.1)
Basophils Relative: 1 %
EOS PCT: 1 %
Eosinophils Absolute: 0.1 10*3/uL (ref 0–0.7)
HEMATOCRIT: 32.4 % — AB (ref 35.0–47.0)
Hemoglobin: 11.2 g/dL — ABNORMAL LOW (ref 12.0–16.0)
LYMPHS PCT: 20 %
Lymphs Abs: 2.6 10*3/uL (ref 1.0–3.6)
MCH: 31.8 pg (ref 26.0–34.0)
MCHC: 34.6 g/dL (ref 32.0–36.0)
MCV: 91.8 fL (ref 80.0–100.0)
MONO ABS: 1.5 10*3/uL — AB (ref 0.2–0.9)
MONOS PCT: 12 %
NEUTROS ABS: 8.5 10*3/uL — AB (ref 1.4–6.5)
Neutrophils Relative %: 66 %
PLATELETS: 231 10*3/uL (ref 150–440)
RBC: 3.53 MIL/uL — ABNORMAL LOW (ref 3.80–5.20)
RDW: 13.3 % (ref 11.5–14.5)
WBC: 12.7 10*3/uL — ABNORMAL HIGH (ref 3.6–11.0)

## 2016-05-04 LAB — TSH: TSH: 0.69 u[IU]/mL (ref 0.350–4.500)

## 2016-05-04 LAB — TROPONIN I: Troponin I: <0.03 ng/mL (ref <0.03)

## 2016-05-04 MED ORDER — ONDANSETRON HCL 4 MG PO TABS: 4.0000 mg | ORAL_TABLET | Freq: Four times a day (QID) | ORAL | Status: DC | PRN

## 2016-05-04 MED ORDER — SENNOSIDES-DOCUSATE SODIUM 8.6-50 MG PO TABS
1.0000 | ORAL_TABLET | Freq: Every evening | ORAL | Status: DC | PRN
  Administered 2016-05-04: 1 via ORAL
  Filled 2016-05-04: qty 1

## 2016-05-04 MED ORDER — LORATADINE 10 MG PO TABS
10.0000 mg | ORAL_TABLET | Freq: Every day | ORAL | Status: DC
  Administered 2016-05-04 – 2016-05-07 (×4): 10 mg via ORAL
  Filled 2016-05-04 (×4): qty 1

## 2016-05-04 MED ORDER — ASPIRIN EC 81 MG PO TBEC
81.0000 mg | DELAYED_RELEASE_TABLET | Freq: Every day | ORAL | Status: DC
  Administered 2016-05-04 – 2016-05-07 (×4): 81 mg via ORAL
  Filled 2016-05-04 (×4): qty 1

## 2016-05-04 MED ORDER — DEXTROSE 5 % IV SOLN: 1.0000 g | Freq: Once | INTRAVENOUS | Status: DC

## 2016-05-04 MED ORDER — CEFTRIAXONE SODIUM-DEXTROSE 1-3.74 GM-% IV SOLR
1.0000 g | INTRAVENOUS | Status: DC
  Administered 2016-05-05 – 2016-05-07 (×3): 1 g via INTRAVENOUS
  Filled 2016-05-04 (×4): qty 50

## 2016-05-04 MED ORDER — ONDANSETRON HCL 4 MG/2ML IJ SOLN: 4.0000 mg | Freq: Four times a day (QID) | INTRAMUSCULAR | Status: DC | PRN

## 2016-05-04 MED ORDER — ENOXAPARIN SODIUM 40 MG/0.4ML ~~LOC~~ SOLN
40.0000 mg | SUBCUTANEOUS | Status: DC
  Administered 2016-05-04 – 2016-05-06 (×3): 40 mg via SUBCUTANEOUS
  Filled 2016-05-04 (×3): qty 0.4

## 2016-05-04 MED ORDER — SODIUM CHLORIDE 0.9 % IV SOLN
INTRAVENOUS | Status: DC
  Administered 2016-05-04 – 2016-05-05 (×2): via INTRAVENOUS

## 2016-05-04 MED ORDER — ACETAMINOPHEN 325 MG PO TABS
650.0000 mg | ORAL_TABLET | Freq: Four times a day (QID) | ORAL | Status: DC | PRN
  Administered 2016-05-05 (×2): 650 mg via ORAL
  Filled 2016-05-04 (×2): qty 2

## 2016-05-04 MED ORDER — CEFTRIAXONE SODIUM-DEXTROSE 1-3.74 GM-% IV SOLR
1.0000 g | Freq: Once | INTRAVENOUS | Status: AC
  Administered 2016-05-04: 1 g via INTRAVENOUS

## 2016-05-04 MED ORDER — SODIUM CHLORIDE 0.9 % IV BOLUS (SEPSIS)
500.0000 mL | Freq: Once | INTRAVENOUS | Status: AC
  Administered 2016-05-04: 500 mL via INTRAVENOUS

## 2016-05-04 MED ORDER — ACETAMINOPHEN 650 MG RE SUPP: 650.0000 mg | Freq: Four times a day (QID) | RECTAL | Status: DC | PRN

## 2016-05-04 MED ORDER — CEFTRIAXONE SODIUM-DEXTROSE 1-3.74 GM-% IV SOLR
INTRAVENOUS | Status: AC
  Administered 2016-05-04: 1 g via INTRAVENOUS
  Filled 2016-05-04: qty 50

## 2016-05-04 MED ORDER — TRAMADOL HCL 50 MG PO TABS
50.0000 mg | ORAL_TABLET | Freq: Four times a day (QID) | ORAL | Status: DC | PRN
  Administered 2016-05-04 – 2016-05-07 (×2): 50 mg via ORAL
  Filled 2016-05-04 (×2): qty 1

## 2016-05-04 MED ORDER — PROPRANOLOL HCL 20 MG PO TABS
20.0000 mg | ORAL_TABLET | Freq: Two times a day (BID) | ORAL | Status: DC
  Administered 2016-05-04 – 2016-05-07 (×7): 20 mg via ORAL
  Filled 2016-05-04 (×7): qty 1

## 2016-05-04 MED ORDER — LOSARTAN POTASSIUM 25 MG PO TABS
25.0000 mg | ORAL_TABLET | Freq: Every day | ORAL | Status: DC
  Administered 2016-05-04 – 2016-05-05 (×2): 25 mg via ORAL
  Filled 2016-05-04 (×2): qty 1

## 2016-05-04 NOTE — ED Notes (Signed)
Patient transported to x-ray. ?

## 2016-05-04 NOTE — Progress Notes (Signed)
Pharmacy Antibiotic Note  Cathy Juarez is a 82 y.o. female admitted on 05/04/2016 with UTI.  Pharmacy has been consulted for ceftriaxone dosing. Pt received one dose ceftriaxone 1 g IV today.  Plan: Will begin ceftriaxone 1g IV q 24 h tomorrow morning. Pharmacy will continue to follow and adjust per consult.  Height: 5\' 4"  (162.6 cm) Weight: 142 lb 6.7 oz (64.6 kg) IBW/kg (Calculated) : 54.7  Temp (24hrs), Avg:97.9 F (36.6 C), Min:97.7 F (36.5 C), Max:98 F (36.7 C)   Recent Labs Lab 05/04/16 0947  WBC 12.7*  CREATININE 1.08*    Estimated Creatinine Clearance: 31.7 mL/min (by C-G formula based on SCr of 1.08 mg/dL (H)).    No Known Allergies  Antimicrobials this admission: Ceftriaxone 11/19 >>   Dose adjustments this admission:  Microbiology results:  BCx:  11/19 UCx: pending   Sputum:    MRSA PCR:   Thank you for allowing pharmacy to be a part of this patient's care.  Darrow Bussing, PharmD Pharmacy Resident 05/04/2016 1:41 PM

## 2016-05-04 NOTE — ED Notes (Signed)
Cathy Juarez called in for MRI. Checklist already completed at this time.  All answers were negative.

## 2016-05-04 NOTE — Progress Notes (Signed)
Family Meeting Note  Advance Directive:yes  Today a meeting took place with the Patient., spouse, son and his wife     The following clinical team members were present during this meeting:MD  The following were discussed:Patient's diagnosis: dizziness evaluation for CVA/BPPV , Patient's progosis: > 12 months and Goals for treatment: Full Code  Additional follow-up to be provided: patient has advance directives filed on the state of [place], She wishes to be FULL CODE. In the case she cannot make decisions due to her incapacity, her husband will make decisions.  Time spent during discussion 18 minutes  Cathy Matthew, MD

## 2016-05-04 NOTE — Care Management (Addendum)
Patient presents from home with increased weakness within the last 24 hours to the point that patient is unable to stand.  This is not patient's baseline. She is diagnosed with UTI.  White count 12.7.  she is afebrile and not tachycardic.  Patient lives with her husband in a single level town home.  There is a hired caregiver that comes into the home 4 hours a day and [facility].  Patient and her husband over lasts several months have  had decline in their functional status but the change this morning is significant. No agency preference.  Referral to [facility] for SN PT Aide, OT if needed.   Discussed observation vs inpatient admission.  Very agreeable to home health nurse and physical therapy.  Discussed home bound criteria and that at present, patient meets that criteria.  Will require a physical therapy evaluation.

## 2016-05-04 NOTE — H&P (Signed)
[facility] [place] NAME: Cathy Juarez    MR#:  [id number]  DATE OF BIRTH:  August 29, 1926  DATE OF ADMISSION:  05/04/2016  PRIMARY CARE PHYSICIAN: [facility]   REQUESTING/REFERRING PHYSICIAN: Dr. Dineen Kid CHIEF COMPLAINT:   Right shoulder pain and inability to walk due to dizziness HISTORY OF PRESENT ILLNESS:  Cathy Juarez  is a 82 y.o. female with a known history of Hypertension who presents with above complaint. Over the past several weeks patient has had dizziness and feelings of lightheadedness. She reports that her feelings of dizziness are worse when she moves from an upward position. She is also complaining of intense right shoulder pain since this morning. She denies numbness or tingling of the right arm. She also is having right lower extremity weakness. She was unable to ambulate this morning. She denies falls or neurological deficits such as slurred speech or aphasia. She was seen at medical in urgent care on October 23 for right lower leg muscle strain.  PAST MEDICAL HISTORY:   Past Medical History:  Diagnosis Date  . Hypertension     PAST SURGICAL HISTORY:   Past Surgical History:  Procedure Laterality Date  . ABDOMINAL HYSTERECTOMY      SOCIAL HISTORY:   Social History  Substance Use Topics  . Smoking status: Never Smoker  . Smokeless tobacco: Never Used  . Alcohol use No    FAMILY HISTORY:  No known problems  DRUG ALLERGIES:  No Known Allergies  REVIEW OF SYSTEMS:   Review of Systems  Constitutional: Negative.  Negative for chills, fever and malaise/fatigue.  HENT: Negative.  Negative for ear discharge, ear pain, hearing loss, nosebleeds and sore throat.   Eyes: Negative.  Negative for blurred vision and pain.  Respiratory: Negative.  Negative for cough, hemoptysis, shortness of breath and wheezing.   Cardiovascular: Negative.  Negative for chest pain, palpitations and leg swelling.   Gastrointestinal: Negative.  Negative for abdominal pain, blood in stool, diarrhea, nausea and vomiting.  Genitourinary: Negative.  Negative for dysuria.  Musculoskeletal: Positive for joint pain (Right shoulder). Negative for back pain.  Skin: Negative.   Neurological: Positive for dizziness and focal weakness (Right lower extremity). Negative for tremors, speech change, seizures and headaches.  Endo/Heme/Allergies: Negative.  Does not bruise/bleed easily.  Psychiatric/Behavioral: Negative.  Negative for depression, hallucinations and suicidal ideas.    MEDICATIONS AT HOME:   Juarez to Admission medications   Medication Sig Start Date End Date Taking? Authorizing Provider  aspirin EC 81 MG tablet Take 81 mg by mouth daily.   Yes Historical Provider, MD  azithromycin (ZITHROMAX) 250 MG tablet Take 500 mg by mouth 2 (two) times a week.    Yes Historical Provider, MD  ipratropium-albuterol (DUONEB) 0.5-2.5 (3) MG/3ML SOLN Take 3 mLs by nebulization every 6 (six) hours as needed.   Yes Historical Provider, MD  loratadine (CLARITIN) 10 MG tablet Take 10 mg by mouth daily.   Yes Historical Provider, MD  losartan (COZAAR) 50 MG tablet Take 0.5 tablets by mouth daily.  04/07/16  Yes Historical Provider, MD  propranolol (INDERAL) 20 MG tablet Take 20 mg by mouth 2 (two) times daily.   Yes Historical Provider, MD      VITAL SIGNS:  Blood pressure (!) 141/50, pulse 69, temperature 98 F (36.7 C), temperature source Oral, resp. rate (!) 24, height 5\' 4"  (1.626 m), weight 64.6 kg (142 lb 6.7 oz), SpO2 100 %.  PHYSICAL EXAMINATION:  Physical Exam  Constitutional: She is oriented to person, place, and time and well-developed, well-nourished, and in no distress. No distress.  HENT:  Head: Normocephalic.  Eyes: No scleral icterus.  Neck: Normal range of motion. Neck supple. No JVD present. No tracheal deviation present.  Cardiovascular: Normal rate, regular rhythm and normal heart sounds.  Exam  reveals no gallop and no friction rub.   No murmur heard. Pulmonary/Chest: Effort normal and breath sounds normal. No respiratory distress. She has no wheezes. She has no rales. She exhibits no tenderness.  Abdominal: Soft. Bowel sounds are normal. She exhibits no distension and no mass. There is no tenderness. There is no rebound and no guarding.  Musculoskeletal: Normal range of motion. She exhibits no edema.  Neurological: She is alert and oriented to person, place, and time.  Right lower extremity 3 out of 5 strength Patient is unable to move right arm past 30. There is no joint tenderness. There is no edema. Positive lateral nystagmus  Skin: Skin is warm. No rash noted. No erythema.  Psychiatric: Affect and judgment normal.      LABORATORY PANEL:   CBC  Recent Labs Lab 05/04/16 0947  WBC 12.7*  HGB 11.2*  HCT 32.4*  PLT 231   ------------------------------------------------------------------------------------------------------------------  Chemistries   Recent Labs Lab 05/04/16 0947  NA 136  K 3.9  CL 103  CO2 26  GLUCOSE 105*  BUN 30*  CREATININE 1.08*  CALCIUM 9.3  AST 23  ALT 10*  ALKPHOS 60  BILITOT 0.9   ------------------------------------------------------------------------------------------------------------------  Cardiac Enzymes  Recent Labs Lab 05/04/16 0947  TROPONINI <0.03   ------------------------------------------------------------------------------------------------------------------  RADIOLOGY:  Dg Chest 2 View  Result Date: 05/04/2016 CLINICAL DATA:  Right shoulder pain and cough. EXAM: CHEST  2 VIEW COMPARISON:  None. FINDINGS: Mild interstitial coarsening at the bases, potentially bronchitic. Changes are most notable in the right middle lobe and lingula and of indeterminate chronicity. No definite bronchiectasis. No air bronchogram, edema, effusion, or pneumothorax. Right shoulder pain with calcifications.  See separate report.  IMPRESSION: Bronchitic markings without focal consolidation. Electronically Signed   By: Monte Fantasia M.D.   On: 05/04/2016 10:31   Dg Shoulder Right  Result Date: 05/04/2016 CLINICAL DATA:  Right shoulder and upper arm pain for a few weeks with cough. No trauma. EXAM: RIGHT SHOULDER - 2+ VIEW COMPARISON:  None. FINDINGS: Well corticated calcifications in the inferior joint space consistent with synovial osteochondromatosis. No fracture dislocation. No other acute abnormalities. IMPRESSION: No acute abnormality.  Synovial osteochondromatosis. Electronically Signed   By: Dorise Bullion III M.D   On: 05/04/2016 10:38    EKG:     IMPRESSION AND PLAN:   82 year old female who presents with right shoulder pain and inability to walk with nystagmus and right lower extremity weakness on examination.  1. Dizziness and lightheaded: Patient will need MRI to evaluate for CVA/central etiology. Patient will need physical therapy consultation for possible BPPV and Epley maneuver if MRI is negative.  2. UTI: Patient is on Rocephin and will need to have follow-up for urine culture  3. Right shoulder pain: Lengthy discussion with family and patient. We will try conservative management with physical therapy consultation. If she continues to have pain then as an outpatient patient may require further workup. Right shoulder x-ray here shows severe arthritis which is likely the etiology of her pain.  4. Essential hypertension: Patient continue outpatient medications which include propanolol and losartan   All the records are reviewed and case  discussed with ED provider. Management plans discussed with the patient and she is in agreement  CODE STATUS: FULL  TOTAL TIME TAKING CARE OF THIS PATIENT: 45 minutes.    Tayna Smethurst M.D on 05/04/2016 at 12:13 PM  Between 7am to 6pm - Pager - [phone]  After 6pm go to [email or web address] - password [email or web address] [facility]  Office   [phone]  CC: Primary care physician; [place]

## 2016-05-04 NOTE — Evaluation (Signed)
Physical Therapy Evaluation Patient Details Name: Cathy Juarez MRN: [id number] DOB: 06/28/1926 Today's Date: 05/04/2016   History of Present Illness  Cathy Juarez is a 82 y.o. female with a known history of hypertension who presents with above complaint. Over the past several weeks patient has had dizziness and feelings of lightheadedness. She reports that her feelings of dizziness are worse when sitting up in bed from a laying position. Pt describes symptoms as "spinning" and says it lasts for minutes when it occurs. However other times she reports that it lasts for hours and occurs when she is walking. She is also complaining of intense right shoulder pain since the morning of admission. Patient reports that her husband has been pulling on her arms to help her up from bed and this may have contributed to her shoulder pain. She denies numbness or tingling of the right arm. She also is having right lower extremity weakness. She was unable to ambulate this morning. She denies falls or neurological deficits such as slurred speech or aphasia. She received medical care at urgent care on October 23 for right lower leg muscle strain. Pt is very scattered in her thinking and conversation during PT evaluation. She is AOx4 but with tangential conversation and multiple questions required to get a direct answer   Clinical Impression  Pt admitted with above diagnosis. Pt currently with functional limitations due to the deficits listed below (see PT Problem List).  Pt is a poor historian and although she is AOx4 her thinking and conversation are very tangential. Pt provides vague descriptions of her dizziness symptoms as well as aggravating and easing factors. She describes the dizziness as the "room spinning," consistent with description of vertigo. She reports that it mostly occurs when she sits up from laying down. Pt denies any presyncopal symptoms. She reports that it lasts for "minutes" but then states "I  don't know maybe hours." These symptoms are new over the last few weeks. She reports that she has difficulty getting up from bed and her husband was helping pull her up this morning when her R shoulder pain started. Unsure if this is related to her current complaint of shoulder pain. Pt is requiring minimal assistance from therapist with bed mobility during evaluation. However she only requires CGA for transfers and ambulation. Pt is reasonably steady with ambulation in hallway with use of rolling walker. She is complaining of 10/10 R shoulder pain today and is very limited in her RUE use due to pain. She will only actively flex her R shoulder approximately 30 degrees and will not allow strength testing of R elbow, wrist, and hand due to R shoulder pain. Pt reports minimal pain in R shoulder with palpation, mostly posterior. No bruising, swelling, or redness noted. No spontaneous or gaze evoked nystagmus noted during oculomotor exam. Normal end range nystagmus present and full ocular ROM. VOR and VOR cancellation negative. No post-headshake nystagmus. Pt with saccadic smooth pursuit and unable to follow instructions accurately for horizontal and vertical saccade testing. Test of skew negative. Deferred head impulse testing as pt struggles to understand instructions provided by therapist Dix-Hallpike test is negative on the R and appears positive on the L with faint upbeating L torsional nystagmus. Interpretation somewhat difficult as pt will not keep her eyes open and her gaze is very saccadic when attempting to observe nystagmus. Pt reports 8/10 vertigo with positive reproduction of symptoms. Pt taken through epley maneuver twice for canalith repositioning. During second round pt reports only 2/10 vertigo  when in [place] position. Epley is somewhat challenging due to poor ability to follow instructions and  R shoulder pain when rolling into R sidelying position. Pt should follow-up with OP vestibular therapist  after discharge in order to repeat Dix-Hallpike and continue Epley as indicated until symptoms are fully resolved. She may require additional assist at home after discharge from family or caregivers due to new R shoulder pain but this is nothing that would benefit from SNF placement. Pt will benefit from skilled PT services to address deficits in strength, balance, and mobility in order to return to full function at home.     Follow Up Recommendations Other (comment);Outpatient PT (OP PT for vestibular therapy, L posterior canal BPPV)    Equipment Recommendations  None recommended by PT;Other (comment) (Needs to use rollator at discharge)    Recommendations for Other Services       Precautions / Restrictions Precautions Precautions: Fall Restrictions Weight Bearing Restrictions: No      Mobility  Bed Mobility Overal bed mobility: Needs Assistance Bed Mobility: Supine to Sit     Supine to sit: Min assist     General bed mobility comments: Pt requires minA+1 for sidelying to sitting due to RUE pain and weakness. Pt with difficulty following commands correctly  Transfers Overall transfer level: Needs assistance Equipment used: Rolling walker (2 wheeled) Transfers: Sit to/from Stand Sit to Stand: Min guard         General transfer comment: Pt demonstrates fair speed/sequencing with sit to stand transfers. Good stability with UE support on rolling walker during transfer  Ambulation/Gait Ambulation/Gait assistance: Min guard Ambulation Distance (Feet): 150 Feet Assistive device: Rolling walker (2 wheeled) Gait Pattern/deviations: Decreased step length - left;Decreased step length - right Gait velocity: Adequate for household mobility Gait velocity interpretation: <1.8 ft/sec, indicative of risk for recurrent falls General Gait Details: Pt ambulates partially around RN station. Demonstrates fair speed and stability with use of rolling walker. No overt LOB during ambulation.  Able to perform head turns with reasonable stability with rolling walker  Stairs            Wheelchair Mobility    Modified Rankin (Stroke Patients Only)       Balance Overall balance assessment: Needs assistance Sitting-balance support: No upper extremity supported Sitting balance-Leahy Scale: Good     Standing balance support: No upper extremity supported Standing balance-Leahy Scale: Fair Standing balance comment: Able to perform feet together and feet apart balance without UE support. Positive Rhomberg and single leg stance 1-2 seconds                             Pertinent Vitals/Pain Pain Assessment: 0-10 Pain Score: 10-Worst pain ever Pain Location: R shoulder Pain Descriptors / Indicators: Sharp Pain Intervention(s): Monitored during session    Home Living Family/patient expects to be discharged to:: Private residence Living Arrangements: Spouse/significant other Available Help at Discharge: Family;Other (Comment) (HH aid 1d/wk) Type of Home: Other(Comment) ([facility]house) Home Access: Level entry     Home Layout: One level Home Equipment: Walker - 4 wheels;Shower seat;Grab bars - toilet;Grab bars - tub/shower      Prior Function Level of Independence: Needs assistance   Gait / Transfers Assistance Needed: Uses rollator at home and for limited community mobility. Denies falls in the last 12 months  ADL's / Homemaking Assistance Needed: Requiring some intermittent assist with ADLs. Requires assist for ADLs  Hand Dominance   Dominant Hand: Right    Extremity/Trunk Assessment   Upper Extremity Assessment: RUE deficits/detail RUE Deficits / Details: Pt with AROM of R shoulder to approximately 30 degrees. AAROM she is able to get to approximately 70 degrees but is limited due to pain. Pt describes pain around lateral upper arm approximately mid shaft of humerus. Denies recent falls. Pt will not perform RUE MMT due to R shoulder pain. LUE  with general deconditioning but no focal weakness.          Lower Extremity Assessment: Generalized weakness         Communication   Communication: No difficulties;HOH  Cognition Arousal/Alertness: Awake/alert Behavior During Therapy: WFL for tasks assessed/performed Overall Cognitive Status: No family/caregiver present to determine baseline cognitive functioning (AOx2, oriented to year but not month, oriented to situation)                      General Comments      Exercises     Assessment/Plan    PT Assessment Patient needs continued PT services  PT Problem List Decreased strength;Decreased activity tolerance;Decreased balance;Decreased mobility;Decreased safety awareness;Other (comment);Pain (Dizziness)          PT Treatment Interventions DME instruction;Gait training;Therapeutic activities;Therapeutic exercise;Balance training;Neuromuscular re-education;Cognitive remediation;Patient/family education;Manual techniques;Other (comment) (Vestibular therapy)    PT Goals (Current goals can be found in the Care Plan section)  Acute Rehab PT Goals Patient Stated Goal: Return to prior level of function at resolve dizziness PT Goal Formulation: With patient Time For Goal Achievement: 05/18/16 Potential to Achieve Goals: Fair    Frequency Min 2X/week   Barriers to discharge Decreased caregiver support Pt reports that husband has dementia    Co-evaluation               End of Session Equipment Utilized During Treatment: Gait belt Activity Tolerance: Patient tolerated treatment well Patient left: in bed;with call bell/phone within reach;with bed alarm set Nurse Communication: Mobility status         Time: 1510-1555 PT Time Calculation (min) (ACUTE ONLY): 45 min   Charges:   PT Evaluation $PT Eval Moderate Complexity: 1 Procedure PT Treatments $Canalith Rep Proc: 8-22 mins   PT G Codes:       Lyndel Safe Mikalyn Hermida PT, DPT    Jaylen Claude 05/04/2016, 4:31 PM

## 2016-05-04 NOTE — Care Management Obs Status (Signed)
[facility] NOTIFICATION   Patient Details  Name: Cathy Juarez MRN: [id number] Date of Birth: Jul 19, 1926   Medicare Observation Status Notification Given:  Yes    Katrina Stack, RN 05/04/2016, 12:34 PM

## 2016-05-04 NOTE — ED Provider Notes (Signed)
[facility] Emergency Department Provider Note  ____________________________________________   First MD Initiated Contact with Patient 05/04/16 0940     (approximate)  I have reviewed the triage vital signs and the nursing notes.   HISTORY  Chief Complaint Arm Pain and Dizziness   HPI Cathy Juarez is a 82 y.o. female with a history of hypertension was presenting to the emergency department with several weeks of increasing weakness as well as dizziness. She says that the dizziness is a spinning sensation combined with lightheadedness when she gets up from bed. This morning, she was unable toget up to walk because of worsening of her weakness and EMS was called at that point. She is also complaining of an aching pain to her right shoulder. She says that there is no pain when she keeps her arm still but it hurts when she moves it. She denies any injury. Denies any ringing or roaring sensation in her ears. Denies any pain with urination. Says she has had a cough over the past several days but denies any fever or sputum production.   Past Medical History:  Diagnosis Date  . Hypertension     Patient Active Problem List   Diagnosis Date Noted  . UTI (urinary tract infection) 05/04/2016    Past Surgical History:  Procedure Laterality Date  . ABDOMINAL HYSTERECTOMY      Prior to Admission medications   Medication Sig Start Date End Date Taking? Authorizing Provider  aspirin EC 81 MG tablet Take 81 mg by mouth daily.   Yes Historical Provider, MD  azithromycin (ZITHROMAX) 250 MG tablet Take 500 mg by mouth 2 (two) times a week.    Yes Historical Provider, MD  ipratropium-albuterol (DUONEB) 0.5-2.5 (3) MG/3ML SOLN Take 3 mLs by nebulization every 6 (six) hours as needed.   Yes Historical Provider, MD  loratadine (CLARITIN) 10 MG tablet Take 10 mg by mouth daily.   Yes Historical Provider, MD  losartan (COZAAR) 50 MG tablet Take 0.5 tablets by mouth daily.   04/07/16  Yes Historical Provider, MD  propranolol (INDERAL) 20 MG tablet Take 20 mg by mouth 2 (two) times daily.   Yes Historical Provider, MD    Allergies Patient has no known allergies.  No family history on file.  Social History Social History  Substance Use Topics  . Smoking status: Never Smoker  . Smokeless tobacco: Never Used  . Alcohol use No    Review of Systems Constitutional: No fever/chills Eyes: No visual changes. ENT: No sore throat. Cardiovascular: Denies chest pain. Respiratory: Denies shortness of breath. Gastrointestinal: No abdominal pain.  No nausea, no vomiting.  No diarrhea.  No constipation. Genitourinary: Negative for dysuria. Musculoskeletal: Negative for back pain. Skin: Negative for rash. Neurological: Negative for headaches, focal weakness or numbness.  10-point ROS otherwise negative.  ____________________________________________   PHYSICAL EXAM:  VITAL SIGNS: ED Triage Vitals  Enc Vitals Group     BP 05/04/16 0939 (!) 144/64     Pulse Rate 05/04/16 0939 77     Resp 05/04/16 0939 16     Temp 05/04/16 0943 98 F (36.7 C)     Temp Source 05/04/16 0943 Oral     SpO2 05/04/16 0939 99 %     Weight 05/04/16 0940 142 lb 6.7 oz (64.6 kg)     Height 05/04/16 0940 5\' 4"  (1.626 m)     Head Circumference --      Peak Flow --      Pain  Score --      Pain Loc --      Pain Edu? --      Excl. in [place]? --     Constitutional: Alert and oriented. Well appearing and in no acute distress. Eyes: Conjunctivae are normal. PERRL. EOMI. Head: Atraumatic.Normal TMs bilaterally. Minimal wax to the bilateral external canals. Nose: No congestion/rhinnorhea. Mouth/Throat: Mucous membranes are moist.   Neck: No stridor.   Cardiovascular: Normal rate, regular rhythm. Grossly normal heart sounds.  Good peripheral circulation. Respiratory: Normal respiratory effort.  No retractions. Lungs CTAB. Gastrointestinal: Soft and nontender. No distention.No CVA  tenderness. Musculoskeletal: No lower extremity tenderness nor edema.  No joint effusions.  Right shoulder without effusion, swelling, induration, warmth but with tenderness around the joint space and pain with passive motion at the shoulder joint. No tenderness along the posterior scapula or suprascapular. Neurologic:  Normal speech and language. No gross focal neurologic deficits are appreciated. No nystagmus. No ataxia on finger to nose testing.  Skin:  Skin is warm, dry and intact. No rash noted. Psychiatric: Mood and affect are normal. Speech and behavior are normal.  ____________________________________________   LABS (all labs ordered are listed, but only abnormal results are displayed)  Labs Reviewed  COMPREHENSIVE METABOLIC PANEL - Abnormal; Notable for the following:       Result Value   Glucose, Bld 105 (*)    BUN 30 (*)    Creatinine, Ser 1.08 (*)    ALT 10 (*)    GFR calc non Af Amer 45 (*)    GFR calc Af Amer 52 (*)    All other components within normal limits  CBC WITH DIFFERENTIAL/PLATELET - Abnormal; Notable for the following:    WBC 12.7 (*)    RBC 3.53 (*)    Hemoglobin 11.2 (*)    HCT 32.4 (*)    Neutro Abs 8.5 (*)    Monocytes Absolute 1.5 (*)    All other components within normal limits  URINALYSIS COMPLETEWITH MICROSCOPIC ([facility] ONLY) - Abnormal; Notable for the following:    Color, Urine YELLOW (*)    APPearance HAZY (*)    Hgb urine dipstick 1+ (*)    Protein, ur 30 (*)    Leukocytes, UA 3+ (*)    Bacteria, UA MANY (*)    Squamous Epithelial / LPF 0-5 (*)    All other components within normal limits  URINE CULTURE  TSH  TROPONIN I   ____________________________________________  EKG  ED ECG REPORT I, Doran Stabler, the attending physician, personally viewed and interpreted this ECG.   Date: 05/04/2016  EKG Time: 0941  Rate: 73  Rhythm: normal sinus rhythm  Axis: Normal axis  Intervals:LVH with secondary repolarization abnormality.   ST&T Change: No ST segment elevation or depression. T-wave inversions in aVL. No previous EKGs for comparison. ____________________________________________  [id number]    DG Shoulder Right (Final result)  Result time 05/04/16 10:38:36  Final result by York Ram, MD (05/04/16 10:38:36)           Narrative:   CLINICAL DATA: Right shoulder and upper arm pain for a few weeks with cough. No trauma.  EXAM: RIGHT SHOULDER - 2+ VIEW  COMPARISON: None.  FINDINGS: Well corticated calcifications in the inferior joint space consistent with synovial osteochondromatosis. No fracture dislocation. No other acute abnormalities.  IMPRESSION: No acute abnormality. Synovial osteochondromatosis.   Electronically Signed By: Dorise Bullion III M.D On: 05/04/2016 10:38  DG Chest 2 View (Final result)  Result time 05/04/16 10:31:53  Final result by Gilford Silvius, MD (05/04/16 10:31:53)           Narrative:   CLINICAL DATA: Right shoulder pain and cough.  EXAM: CHEST 2 VIEW  COMPARISON: None.  FINDINGS: Mild interstitial coarsening at the bases, potentially bronchitic. Changes are most notable in the right middle lobe and lingula and of indeterminate chronicity. No definite bronchiectasis. No air bronchogram, edema, effusion, or pneumothorax.  Right shoulder pain with calcifications. See separate report.  IMPRESSION: Bronchitic markings without focal consolidation.   Electronically Signed By: Monte Fantasia M.D. On: 05/04/2016 10:31          ____________________________________________   PROCEDURES  Procedure(s) performed:   Procedures  Critical Care performed:   ____________________________________________   INITIAL IMPRESSION / ASSESSMENT AND PLAN / ED COURSE  Pertinent labs & imaging results that were available during my care of the patient were reviewed by me and considered in my medical decision making  (see chart for details).    Clinical Course   ----------------------------------------- 11:49 AM on 05/04/2016 -----------------------------------------  I discussed the case with the patient as well as her family including her husband and daughter-in-law who is a physical therapist. The family says that they are unable to care for the patient at home, especially because she is unable to walk as of this morning, and were considering a transition to assisted living. They were counseled regarding that admission would likely be under an observation status and they're understanding. However, they would still like to come in under the observation status. I discussed the x-ray findings with them regarding the osteochondromatosis. The patient was signed out to Dr. Benjie Karvonen of the medicine service. The patient will be receiving IV antibiotics for her UTI.  Symptoms of weakness and lightheadedness/dizziness likely 2/2 the UTI. ____________________________________________   FINAL CLINICAL IMPRESSION(S) / ED DIAGNOSES  Right shoulder pain. UTI. Weakness.    NEW MEDICATIONS STARTED DURING THIS VISIT:  New Prescriptions   No medications on file     Note:  This document was prepared using Dragon voice recognition software and may include unintentional dictation errors.    Orbie Pyo, MD 05/04/16 1150

## 2016-05-04 NOTE — ED Triage Notes (Signed)
Patient brought in by [facility] from home for right arm pain and dizziness. Per EMS when patient woke up this morning she was having pain in her right arm, pain is worse with movement. Patient reports she has had the pain for about 3 weeks, patient has also been having dizziness for about 3 weeks.

## 2016-05-04 NOTE — Progress Notes (Signed)
Pt is a&o, forgetful at times to time. Pt c/o right shoulder pain 10/10, improvement noted with PRN Tramadol. IVF infusing per MD order. Pt assisted up to [facility] to void, requiring 1 assist. Pt is high fall risk with bed alarm activated.

## 2016-05-05 LAB — BASIC METABOLIC PANEL
Anion gap: 7 (ref 5–15)
BUN: 25 mg/dL — AB (ref 6–20)
CALCIUM: 8.7 mg/dL — AB (ref 8.9–10.3)
CHLORIDE: 106 mmol/L (ref 101–111)
CO2: 23 mmol/L (ref 22–32)
CREATININE: 0.84 mg/dL (ref 0.44–1.00)
Glucose, Bld: 105 mg/dL — ABNORMAL HIGH (ref 65–99)
Potassium: 3.7 mmol/L (ref 3.5–5.1)
SODIUM: 136 mmol/L (ref 135–145)

## 2016-05-05 LAB — CBC
HCT: 29.8 % — ABNORMAL LOW (ref 35.0–47.0)
Hemoglobin: 10 g/dL — ABNORMAL LOW (ref 12.0–16.0)
MCH: 30.9 pg (ref 26.0–34.0)
MCHC: 33.6 g/dL (ref 32.0–36.0)
MCV: 92 fL (ref 80.0–100.0)
PLATELETS: 202 10*3/uL (ref 150–440)
RBC: 3.24 MIL/uL — AB (ref 3.80–5.20)
RDW: 12.9 % (ref 11.5–14.5)
WBC: 13.7 10*3/uL — AB (ref 3.6–11.0)

## 2016-05-05 MED ORDER — LOSARTAN POTASSIUM 50 MG PO TABS
50.0000 mg | ORAL_TABLET | Freq: Every day | ORAL | Status: DC
  Administered 2016-05-06 – 2016-05-07 (×2): 50 mg via ORAL
  Filled 2016-05-05 (×2): qty 1

## 2016-05-05 MED ORDER — LOSARTAN POTASSIUM 25 MG PO TABS
25.0000 mg | ORAL_TABLET | Freq: Once | ORAL | Status: AC
  Administered 2016-05-05: 25 mg via ORAL
  Filled 2016-05-05: qty 1

## 2016-05-05 MED ORDER — OXYCODONE HCL 5 MG PO TABS
5.0000 mg | ORAL_TABLET | ORAL | Status: DC | PRN
  Administered 2016-05-07: 5 mg via ORAL
  Filled 2016-05-05: qty 1

## 2016-05-05 NOTE — Care Management (Signed)
Will set up nursing and PT at discharge with [facility]. It is anticipated patient will be ready for discharge tomorrow.

## 2016-05-05 NOTE — Progress Notes (Signed)
Physical Therapy Treatment Patient Details Name: Cathy Juarez MRN: [id number] DOB: 1927/03/29 Today's Date: 05/05/2016    History of Present Illness Cathy Juarez is a 82 y.o. female with a known history of hypertension who presents with above complaint. Over the past several weeks patient has had dizziness and feelings of lightheadedness. She reports that her feelings of dizziness are worse when sitting up in bed from a laying position. Pt describes symptoms as "spinning" and says it lasts for minutes when it occurs. However other times she reports that it lasts for hours and occurs when she is walking. She is also complaining of intense right shoulder pain since the morning of admission. Patient reports that her husband has been pulling on her arms to help her up from bed and this may have contributed to her shoulder pain. She denies numbness or tingling of the right arm. She also is having right lower extremity weakness. She was unable to ambulate this morning. She denies falls or neurological deficits such as slurred speech or aphasia. She received medical care at urgent care on October 23 for right lower leg muscle strain. Pt is very scattered in her thinking and conversation during PT evaluation. She is AOx4 but with tangential conversation and multiple questions required to get a direct answer     PT Comments    Pt is making gradual progress towards goals. No reports of dizziness noted this date with head turns. No reports of dizziness with change in position. Deferred Epley maneuver this date. Pt still complains heavily of R shoulder pain. Negative imaging noted. Pain increased in dependent position. Once in recliner, supported R shoulder on pillows. Recommend continued mobility with shoulder sling applied. RN notified. Safe to ambulate with staff with pt holding IV pole for balance. Not quite at baseline level at this time. Recommend HHPT at this time and then progressing to OP if symptoms  persist.  Follow Up Recommendations  Home health PT;Supervision for mobility/OOB     Equipment Recommendations  None recommended by PT    Recommendations for Other Services       Precautions / Restrictions Precautions Precautions: Fall Restrictions Weight Bearing Restrictions: No    Mobility  Bed Mobility Overal bed mobility: Needs Assistance Bed Mobility: Supine to Sit     Supine to sit: Min assist     General bed mobility comments: Assist for bed mobility as pt very guarded on R side. Once seated at EOB, able to sit with supervision  Transfers Overall transfer level: Needs assistance Equipment used: Rolling walker (2 wheeled) Transfers: Sit to/from Stand Sit to Stand: Min guard         General transfer comment: able to perform multiple standing attempts with and with out RW. Good stability noted with HHA. Pt complains of R shoulder pain with movement, did not want to hold onto RW  Ambulation/Gait Ambulation/Gait assistance: Min guard Ambulation Distance (Feet): 15 Feet Assistive device: 1 person hand held assist Gait Pattern/deviations: Step-to pattern Gait velocity: Adequate for household mobility   General Gait Details: Pt complains of increased R shoulder pain in dependent position. Pt ambulated in room holding IV pole and step to gait pattern around bed to chair. Recommend further ambulation trial with shoulder sling in place.   Stairs            Wheelchair Mobility    Modified Rankin (Stroke Patients Only)       Balance  Cognition Arousal/Alertness: Awake/alert Behavior During Therapy: WFL for tasks assessed/performed Overall Cognitive Status: Within Functional Limits for tasks assessed                      Exercises      General Comments        Pertinent Vitals/Pain Pain Assessment: Faces Faces Pain Scale: Hurts whole lot Pain Location: R shoulder Pain Descriptors /  Indicators: Dull;Discomfort Pain Intervention(s): Limited activity within patient's tolerance;Repositioned    Home Living                      Prior Function            PT Goals (current goals can now be found in the care plan section) Acute Rehab PT Goals Patient Stated Goal: Return to prior level of function at resolve dizziness PT Goal Formulation: With patient Time For Goal Achievement: 05/18/16 Potential to Achieve Goals: Fair Progress towards PT goals: Progressing toward goals    Frequency    Min 2X/week      PT Plan Current plan remains appropriate    Co-evaluation             End of Session Equipment Utilized During Treatment: Gait belt Activity Tolerance: Patient tolerated treatment well Patient left: in chair;with chair alarm set     Time: 1110-1137 PT Time Calculation (min) (ACUTE ONLY): 27 min  Charges:  $Gait Training: 8-22 mins $Therapeutic Activity: 8-22 mins                    G Codes:      Cathy Juarez May 17, 2016, 11:47 AM  Greggory Stallion, PT, DPT [phone]

## 2016-05-05 NOTE — Progress Notes (Signed)
[place] [facility] [place] NAME: Cathy Juarez    MR#:  [id number]  DATE OF BIRTH:  February 23, 1927  SUBJECTIVE:  CHIEF COMPLAINT:  Patient is out of bed in chair reporting right shoulder pain, daughter-in-law at bedside  REVIEW OF SYSTEMS:  CONSTITUTIONAL: No fever, fatigue or weakness.  EYES: No blurred or double vision.  EARS, NOSE, AND THROAT: No tinnitus or ear pain.  RESPIRATORY: No cough, shortness of breath, wheezing or hemoptysis.  CARDIOVASCULAR: No chest pain, orthopnea, edema.  GASTROINTESTINAL: No nausea, vomiting, diarrhea or abdominal pain.  GENITOURINARY: No dysuria, hematuria.  ENDOCRINE: No polyuria, nocturia,  HEMATOLOGY: No anemia, easy bruising or bleeding SKIN: No rash or lesion. MUSCULOSKELETAL:Reporting right shoulder pain No joint pain or arthritis.   NEUROLOGIC: No tingling, numbness, weakness.  PSYCHIATRY: No anxiety or depression.   DRUG ALLERGIES:  No Known Allergies  VITALS:  Blood pressure (!) 169/49, pulse 76, temperature 98.3 F (36.8 C), temperature source Oral, resp. rate 18, height 5\' 4"  (1.626 m), weight 64.6 kg (142 lb 6.7 oz), SpO2 97 %.  PHYSICAL EXAMINATION:  GENERAL:  82 y.o.-year-old patient lying in the bed with no acute distress.  EYES: Pupils equal, round, reactive to light and accommodation. No scleral icterus. Extraocular muscles intact.  HEENT: Head atraumatic, normocephalic. Oropharynx and nasopharynx clear.  NECK:  Supple, no jugular venous distention. No thyroid enlargement, no tenderness.  LUNGS: Normal breath sounds bilaterally, no wheezing, rales,rhonchi or crepitation. No use of accessory muscles of respiration.  CARDIOVASCULAR: S1, S2 normal. No murmurs, rubs, or gallops.  ABDOMEN: Soft, nontender, nondistended. Bowel sounds present. No organomegaly or mass.  EXTREMITIES:Right shoulder is tender, range of motion is limited No pedal edema, cyanosis, or clubbing.  NEUROLOGIC:  Cranial nerves II through XII are intact. Muscle strength 5/5 in all extremities except right shoulder. Sensation intact. Gait not checked.  PSYCHIATRIC: The patient is alert and oriented x 3.  SKIN: No obvious rash, lesion, or ulcer.    LABORATORY PANEL:   CBC  Recent Labs Lab 05/05/16 0339  WBC 13.7*  HGB 10.0*  HCT 29.8*  PLT 202   ------------------------------------------------------------------------------------------------------------------  Chemistries   Recent Labs Lab 05/04/16 0947 05/05/16 0339  NA 136 136  K 3.9 3.7  CL 103 106  CO2 26 23  GLUCOSE 105* 105*  BUN 30* 25*  CREATININE 1.08* 0.84  CALCIUM 9.3 8.7*  AST 23  --   ALT 10*  --   ALKPHOS 60  --   BILITOT 0.9  --    ------------------------------------------------------------------------------------------------------------------  Cardiac Enzymes  Recent Labs Lab 05/04/16 0947  TROPONINI <0.03   ------------------------------------------------------------------------------------------------------------------  RADIOLOGY:  Dg Chest 2 View  Result Date: 05/04/2016 CLINICAL DATA:  Right shoulder pain and cough. EXAM: CHEST  2 VIEW COMPARISON:  None. FINDINGS: Mild interstitial coarsening at the bases, potentially bronchitic. Changes are most notable in the right middle lobe and lingula and of indeterminate chronicity. No definite bronchiectasis. No air bronchogram, edema, effusion, or pneumothorax. Right shoulder pain with calcifications.  See separate report. IMPRESSION: Bronchitic markings without focal consolidation. Electronically Signed   By: Monte Fantasia M.D.   On: 05/04/2016 10:31   Dg Shoulder Right  Result Date: 05/04/2016 CLINICAL DATA:  Right shoulder and upper arm pain for a few weeks with cough. No trauma. EXAM: RIGHT SHOULDER - 2+ VIEW COMPARISON:  None. FINDINGS: Well corticated calcifications in the inferior joint space consistent with synovial osteochondromatosis. No fracture  dislocation. No other  acute abnormalities. IMPRESSION: No acute abnormality.  Synovial osteochondromatosis. Electronically Signed   By: Dorise Bullion III M.D   On: 05/04/2016 10:38   Mr Brain Wo Contrast  Result Date: 05/04/2016 CLINICAL DATA:  Weakness.  Lightheadedness. EXAM: MRI HEAD WITHOUT CONTRAST TECHNIQUE: Multiplanar, multiecho pulse sequences of the brain and surrounding structures were obtained without intravenous contrast. COMPARISON:  None. FINDINGS: Brain: Lateral ventriculomegaly with of the third ventricle and temporal horns. Corpus callosum shows upward convexity on sagittal images. No obstructive hydrocephalus. No acute infarct, hemorrhage, or mass. Remote left lateral lenticulostriate infarct extending from external capsule to caudate. Mild periventricular FLAIR hyperintensity attributed chronic microvascular disease. Vascular: Normal flow voids. Skull and upper cervical spine: Moderate ligamentous thickening behind the dens. No erosive changes noted. Sinuses/Orbits: Right-sided cataract resection.  No acute finding. IMPRESSION: 1. No acute finding, including infarct. 2. Ventriculomegaly which could be from central predominant atrophy. Correlate for NPH symptoms which can also have this appearance. 3. Mild chronic microvascular disease in the cerebral white matter. 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 Electronically Signed   By: Monte Fantasia M.D.   On: 05/04/2016 14:15    EKG:   Orders placed or performed during the hospital encounter of 05/04/16  . ED EKG  . ED EKG    ASSESSMENT AND PLAN:    82 year old female who presents with right shoulder pain and inability to walk with nystagmus and right lower extremity weakness on examination.  1. Dizziness and lightheaded: NEAR SYNCOPE -secondary to vasovagal/UTI Reports she was dizzy from right shoulder pain while husband was maneuvering to bring her out of bed  Patient MRI negative Physical therapy has  recommended home health PT and RN  Provide IV fluids  Patient is clinically feeling better ?? normal pressure hydrocephalus per MRI -outpatient neurology follow-up is recommended  2. UTI: Patient is on Rocephin and will need to have follow-up for urine culture, pending  3. Right shoulder pain: Right shoulder x-ray here shows severe arthritis which is likely the etiology of her pain. Right shoulder sling PT has required home health PT Pain management as needed  4. Essential hypertension: Blood pressure is elevated  Patient continue outpatient medications which include propanolol and losartan, increase losartan dose to 50 mg    All the records are reviewed and case discussed with Care Management/Social Workerr. Management plans discussed with the patient, family and they are in agreement.  CODE STATUS:  FC, HUSBAND HCPOA  TOTAL TIME TAKING CARE OF THIS PATIENT: 37 minutes.   POSSIBLE D/C IN 1-2 DAYS, DEPENDING ON CLINICAL CONDITION.  Note: This dictation was prepared with Dragon dictation along with smaller phrase technology. Any transcriptional errors that result from this process are unintentional.   Nicholes Mango M.D on 05/05/2016 at 1:30 PM  Between 7am to 6pm - Pager - [phone] After 6pm go to [email or web address] - password [email or web address] [email or web address]  Office  [phone]  CC: Primary care physician; [place]

## 2016-05-05 NOTE — Progress Notes (Signed)
Pharmacy Antibiotic Note  Cathy Juarez is a 82 y.o. female admitted on 05/04/2016 with UTI.  Pharmacy has been consulted for ceftriaxone dosing. Pt received one dose ceftriaxone 1 g IV today.  Plan: Continue ceftriaxone 1g IV q 24 h. Pharmacy will continue to follow and adjust per consult.  Height: 5\' 4"  (162.6 cm) Weight: 142 lb 6.7 oz (64.6 kg) IBW/kg (Calculated) : 54.7  Temp (24hrs), Avg:98.1 F (36.7 C), Min:97.7 F (36.5 C), Max:98.4 F (36.9 C)   Recent Labs Lab 05/04/16 0947 05/05/16 0339  WBC 12.7* 13.7*  CREATININE 1.08* 0.84    Estimated Creatinine Clearance: 40.7 mL/min (by C-G formula based on SCr of 0.84 mg/dL).    No Known Allergies  Antimicrobials this admission: Ceftriaxone 11/19 >>   Dose adjustments this admission:  Microbiology results:  BCx:  11/19 UCx: pending   Sputum:    MRSA PCR:   11/19 UA: + bacteria, + leuks  Thank you for allowing pharmacy to be a part of this patient's care.  Carmin Richmond, PharmD Pharmacy Resident 05/05/2016 9:49 AM

## 2016-05-06 NOTE — Progress Notes (Signed)
Physical Therapy Treatment Patient Details Name: Cathy Juarez MRN: [id number] DOB: 01-Nov-1926 Today's Date: 05/06/2016    History of Present Illness Cathy Juarez is a 82 y.o. female with a known history of hypertension who presents with above complaint. Over the past several weeks patient has had dizziness and feelings of lightheadedness. She reports that her feelings of dizziness are worse when sitting up in bed from a laying position. Pt describes symptoms as "spinning" and says it lasts for minutes when it occurs. However other times she reports that it lasts for hours and occurs when she is walking. She is also complaining of intense right shoulder pain since the morning of admission. Patient reports that her husband has been pulling on her arms to help her up from bed and this may have contributed to her shoulder pain. She denies numbness or tingling of the right arm. She also is having right lower extremity weakness. She was unable to ambulate this morning. She denies falls or neurological deficits such as slurred speech or aphasia. She received medical care at urgent care on October 23 for right lower leg muscle strain. Pt is very scattered in her thinking and conversation during PT evaluation. She is AOx4 but with tangential conversation and multiple questions required to get a direct answer     PT Comments    Pt is making gradual progress towards goals with ability to ambulate further this date, however still appears self limiting secondary to pain in R shoulder. Able to participate in there-ex this date. Pt reports no dizziness symptoms this date. Recommend continued use of IV pole for mobility for stability. Unable to use RW at this time secondary to R shoulder pain. Recommend sling for use during mobility. Plan to dc to ALF next date.  Follow Up Recommendations  Home health PT;Supervision for mobility/OOB     Equipment Recommendations  None recommended by PT    Recommendations  for Other Services       Precautions / Restrictions Precautions Precautions: Fall Restrictions Weight Bearing Restrictions: No    Mobility  Bed Mobility               General bed mobility comments: not performed as pt received in recliner  Transfers Overall transfer level: Needs assistance Equipment used: 1 person hand held assist Transfers: Sit to/from Stand Sit to Stand: Min guard         General transfer comment: On first attempt, pt reaches out to therapist for assistance to stand. Educated technique to push from seated surface. Once standing, pt with slight unsteadiness, holds to IV pole for assistance.   Ambulation/Gait Ambulation/Gait assistance: Min guard Ambulation Distance (Feet): 40 Feet Assistive device: 1 person hand held assist Gait Pattern/deviations: Step-through pattern     General Gait Details: Attempted to ambulate in hallway, however pt reports R shoulder pain with all movement. Self limiting in ambulation and requests to return back to room. Pt able to push IV pole with L hand and is very guarded with R shoulder. Cues given for sequencing. Unsteadiness noted with cues to hold onto IV pole for balance.   Stairs            Wheelchair Mobility    Modified Rankin (Stroke Patients Only)       Balance                                    Cognition  Arousal/Alertness: Awake/alert Behavior During Therapy: WFL for tasks assessed/performed Overall Cognitive Status: History of cognitive impairments - at baseline                      Exercises Other Exercises Other Exercises: Seated ther-ex performed including alt. marching, LAQ, SLRs, and hip abd/add. All ther-ex performed x 12 reps with cues for sequencing and supervision for correct technique.    General Comments        Pertinent Vitals/Pain Pain Assessment: Faces Faces Pain Scale: Hurts even more Pain Location: R shoulder Pain Descriptors / Indicators:  Dull;Discomfort;Aching;Nagging Pain Intervention(s): Limited activity within patient's tolerance;Repositioned    Home Living                      Prior Function            PT Goals (current goals can now be found in the care plan section) Acute Rehab PT Goals Patient Stated Goal: Return to prior level of function at resolve dizziness PT Goal Formulation: With patient Time For Goal Achievement: 05/18/16 Potential to Achieve Goals: Fair Progress towards PT goals: Progressing toward goals    Frequency    Min 2X/week      PT Plan Current plan remains appropriate    Co-evaluation             End of Session Equipment Utilized During Treatment: Gait belt Activity Tolerance: Patient tolerated treatment well Patient left: in chair;with chair alarm set     Time: 1317-1340 PT Time Calculation (min) (ACUTE ONLY): 23 min  Charges:  $Gait Training: 8-22 mins $Therapeutic Exercise: 8-22 mins                    G Codes:      Cathy Juarez 04-Jun-2016, 2:25 PM  Greggory Stallion, PT, DPT [phone]

## 2016-05-06 NOTE — Progress Notes (Signed)
Pt has no IV site. Tried 5x last night. Dr aware and said for day shift to address. Cont to monitor.

## 2016-05-06 NOTE — Progress Notes (Signed)
Patient accepted bed offer from [facility]. Plan is for patient to D/C to [place] for respite care. Per MD patient is not ready for D/C today and will likely be ready tomorrow. [facility] administrator is aware of above. Clinical Social Worker (CSW) will continue to follow and assist as needed.   [employer], LCSW [phone]

## 2016-05-06 NOTE — Progress Notes (Addendum)
[facility] is sending a nurse out today to evaluate patient to determine if she is appropriate for ALF respite care. Patient's daughter in law [name] is aware of above.   Per [facility] staff member they can accept patient tomorrow. Patient's daughter in law is at bedside and aware of above. Plan is for patient to D/C to [facility].   [employer], LCSW [phone]

## 2016-05-06 NOTE — NC FL2 (Signed)
[place] LEVEL OF CARE SCREENING TOOL     IDENTIFICATION  Patient Name: Cathy Juarez Birthdate: September 15, 1926 Sex: female Admission Date (Current Location): 05/04/2016  [facility] and [place] Number:  Engineering geologist and Address:  [facility], [address], [place], [place] [address]      Provider Number: [id number]  Attending Physician Name and Address:  Nicholes Mango, MD  Relative Name and Phone Number:       Current Level of Care: Hospital Recommended Level of Care: [place] Prior Approval Number:    Date Approved/Denied:   PASRR Number:    Discharge Plan: Domiciliary (Rest home) ([place] )    Current Diagnoses: Patient Active Problem List   Diagnosis Date Noted  . UTI (urinary tract infection) 05/04/2016    Orientation RESPIRATION BLADDER Height & Weight     Self, Time, Situation, Place  Normal Incontinent Weight: 142 lb 6.7 oz (64.6 kg) Height:  5\' 4"  (162.6 cm)  BEHAVIORAL SYMPTOMS/MOOD NEUROLOGICAL BOWEL NUTRITION STATUS   (None.)  (None.) Continent Diet (Diet: Regular )  AMBULATORY STATUS COMMUNICATION OF NEEDS Skin   Limited Assist Verbally Normal                       Personal Care Assistance Level of Assistance  Bathing, Feeding, Dressing Bathing Assistance: Limited assistance Feeding assistance: Limited assistance Dressing Assistance: Limited assistance     Functional Limitations Info  Sight, Hearing, Speech Sight Info: Adequate Hearing Info: Impaired Speech Info: Adequate    SPECIAL CARE FACTORS FREQUENCY  PT (By licensed PT), OT (By licensed OT)     PT Frequency:  (2-3) OT Frequency:  (2-3)            Contractures      Additional Factors Info  Code Status, Allergies Code Status Info:  (Full Code) Allergies Info:  (No Known Allergies)           Current Medications (05/06/2016):  This is the current hospital active medication list Current  Facility-Administered Medications  Medication Dose Route Frequency Provider Last Rate Last Dose  . 0.9 %  sodium chloride infusion   Intravenous Continuous Bettey Costa, MD 75 mL/hr at 05/05/16 0611    . acetaminophen (TYLENOL) tablet 650 mg  650 mg Oral Q6H PRN Bettey Costa, MD   650 mg at 05/05/16 1441   Or  . acetaminophen (TYLENOL) suppository 650 mg  650 mg Rectal Q6H PRN Bettey Costa, MD      . aspirin EC tablet 81 mg  81 mg Oral Daily Bettey Costa, MD   81 mg at 05/06/16 0931  . cefTRIAXone (ROCEPHIN) IVPB 1 g  1 g Intravenous Q24H Merilyn Baba, RPH   1 g at 05/06/16 1019  . enoxaparin (LOVENOX) injection 40 mg  40 mg Subcutaneous Q24H Sital Mody, MD   40 mg at 05/05/16 1441  . loratadine (CLARITIN) tablet 10 mg  10 mg Oral Daily Bettey Costa, MD   10 mg at 05/06/16 0931  . losartan (COZAAR) tablet 50 mg  50 mg Oral Daily Nicholes Mango, MD   50 mg at 05/06/16 0931  . ondansetron (ZOFRAN) tablet 4 mg  4 mg Oral Q6H PRN Bettey Costa, MD       Or  . ondansetron (ZOFRAN) injection 4 mg  4 mg Intravenous Q6H PRN Sital Mody, MD      . oxyCODONE (Oxy IR/ROXICODONE) immediate release tablet 5 mg  5  mg Oral Q4H PRN Nicholes Mango, MD      . propranolol (INDERAL) tablet 20 mg  20 mg Oral BID Bettey Costa, MD   20 mg at 05/06/16 0931  . senna-docusate (Senokot-S) tablet 1 tablet  1 tablet Oral QHS PRN Bettey Costa, MD   1 tablet at 05/04/16 1409  . traMADol (ULTRAM) tablet 50 mg  50 mg Oral Q6H PRN Bettey Costa, MD   50 mg at 05/04/16 2123     Discharge Medications: Please see discharge summary for a list of discharge medications.  Relevant Imaging Results:  Relevant Lab Results:   Additional Information  (SSN: [id number])  Danie Chandler, Student-Social Work

## 2016-05-06 NOTE — Progress Notes (Signed)
   05/04/16 1632  PT Time Calculation  PT Start Time (ACUTE ONLY) 1510  PT Stop Time (ACUTE ONLY) 1555  PT Time Calculation (min) (ACUTE ONLY) 45 min  PT G-Codes **NOT FOR INPATIENT CLASS**  Functional Assessment Tool Used clinical judgement  Functional Limitation Mobility: Walking and moving around  Mobility: Walking and Moving Around Current Status [id number]) CK  Mobility: Walking and Moving Around Goal Status [id number]) CI  PT General Charges  $$ ACUTE PT VISIT 1 Procedure  PT Evaluation  $PT Eval Moderate Complexity 1 Procedure  PT Treatments  $Canalith Rep Proc 8-22 mins   Late entry G-Codes entered by Phillips Grout PT, DPT  after chart review. Assessment performed and documented by Phillips Grout PT, DPT  .   Lyndel Safe Hayven Fatima PT, DPT   05/06/16 4:23 PM

## 2016-05-06 NOTE — Care Management (Signed)
Daughter wants to cancel [facility]. [facility] has in house PT. [place] notified.

## 2016-05-06 NOTE — Clinical Social Work Note (Signed)
Clinical Social Work Assessment  Patient Details  Name: Cathy Juarez MRN: [id number] Date of Birth: [birth year]  Date of referral:  05/06/16               Reason for consult:  Discharge Planning, Facility Placement                Permission sought to share information with:  Chartered certified accountant granted to share information::  Yes, Verbal Permission Granted  Name::      [place]::   [place]   Relationship::     Contact Information:     Housing/Transportation Living arrangements for the past 2 months:  [place] of Information:  Patient, Adult Children Patient Interpreter Needed:  None Criminal Activity/Legal Involvement Pertinent to Current Situation/Hospitalization:  No - Comment as needed Significant Relationships:  Adult Children, Spouse Lives with:  Spouse Do you feel safe going back to the place where you live?  Yes Need for family participation in patient care:  Yes (Comment)  Care giving concerns:  Patient lives with her husband Herbie Baltimore in a town house in [place].     Social Worker assessment / plan:  Holiday representative ([place]) received verbal consult from RN that patient's family is requesting ALF respite care. CSW met with patient and her son [name] and daughter in law [name] were at bedside. CSW introduced self and explained role of CSW department. Per patient she lives with her husband in [place] and her POA is her son [name]. Per [name] patient has 3 adult children (2 daughters and 1 son). Per daughter in law she is a PT and works in the [employer] and understands that patient is observation and Medicare will not pay for rehab at a SNF. Daughter in law reported that they are considering respite care at [facility] or [facility]. Per daughter in law they can pay out of pocket for respite care. Daughter in law reported that patient has been to [facility] before for respite care about 1.5 years ago.  Son and daughter in law are aware that if this cannot be worked out before patient is ready for discharge then patient will go home until respite care is worked out.   CSW contacted [facility] and spoke with [place]. Per Webb Laws in marketing will be the one to accept admissions. CSW left Richard a Advertising account executive. CSW sent referral to [facility]. CSW contacted [facility] administrator who reported that they do have respite beds open and the family will have to come to [place] to sign paper work and write 14 day check up front. Per Horris Latino a PASARR is not required for respite care. CSW sent referral to [facility]. CSW will continue to follow and assist as needed.       Employment status:  Retired Forensic scientist:  Medicare PT Recommendations:  Home with [place] / Referral to community resources:  Other (Comment Required) (Patient's family is requesting ALF respite care. )  Patient/Family's Response to care:  Patient and family are requesting respite care at an ALF.   Patient/Family's Understanding of and Emotional Response to Diagnosis, Current Treatment, and Prognosis:  Patient was very pleasant and thanked CSW for assistance.   Emotional Assessment Appearance:  Appears stated age Attitude/Demeanor/Rapport:    Affect (typically observed):  Accepting, Adaptable, Pleasant Orientation:  Oriented to Self, Oriented to Place, Oriented to  Time, Oriented to Situation Alcohol / Substance use:  Not Applicable Psych involvement (Current and /or in the community):  No (Comment)  Discharge Needs  Concerns to be addressed:  Discharge Planning Concerns Readmission within the last 30 days:  No Current discharge risk:  Physical Impairment Barriers to Discharge:  Continued Medical Work up   [employer], Veronia Beets, LCSW 05/06/2016, 11:19 AM

## 2016-05-06 NOTE — Progress Notes (Signed)
[facility] [place] NAME: Cathy Juarez    MR#:  [id number]  DATE OF BIRTH:  1926/09/25  SUBJECTIVE:  CHIEF COMPLAINT:  Patient is out of bed in chair reporting improved right shoulder pain, dizziness improved daughter-in-law at bedside  REVIEW OF SYSTEMS:  CONSTITUTIONAL: No fever, fatigue or weakness.  EYES: No blurred or double vision.  EARS, NOSE, AND THROAT: No tinnitus or ear pain.  RESPIRATORY: No cough, shortness of breath, wheezing or hemoptysis.  CARDIOVASCULAR: No chest pain, orthopnea, edema.  GASTROINTESTINAL: No nausea, vomiting, diarrhea or abdominal pain.  GENITOURINARY: No dysuria, hematuria.  ENDOCRINE: No polyuria, nocturia,  HEMATOLOGY: No anemia, easy bruising or bleeding SKIN: No rash or lesion. MUSCULOSKELETAL:Reporting right shoulder pain No joint pain or arthritis.   NEUROLOGIC: No tingling, numbness, weakness.  PSYCHIATRY: No anxiety or depression.   DRUG ALLERGIES:  No Known Allergies  VITALS:  Blood pressure (!) 161/48, pulse 84, temperature 98.1 F (36.7 C), temperature source Oral, resp. rate 19, height 5\' 4"  (1.626 m), weight 64.6 kg (142 lb 6.7 oz), SpO2 99 %.  PHYSICAL EXAMINATION:  GENERAL:  81 y.o.-year-old patient lying in the bed with no acute distress.  EYES: Pupils equal, round, reactive to light and accommodation. No scleral icterus. Extraocular muscles intact.  HEENT: Head atraumatic, normocephalic. Oropharynx and nasopharynx clear.  NECK:  Supple, no jugular venous distention. No thyroid enlargement, no tenderness.  LUNGS: Normal breath sounds bilaterally, no wheezing, rales,rhonchi or crepitation. No use of accessory muscles of respiration.  CARDIOVASCULAR: S1, S2 normal. No murmurs, rubs, or gallops.  ABDOMEN: Soft, nontender, nondistended. Bowel sounds present. No organomegaly or mass.  EXTREMITIES:Right shoulder is tender, range of motion is limited No pedal edema, cyanosis, or  clubbing.  NEUROLOGIC: Cranial nerves II through XII are intact. Muscle strength 5/5 in all extremities except right shoulder. Sensation intact. Gait not checked.  PSYCHIATRIC: The patient is alert and oriented x 3.  SKIN: No obvious rash, lesion, or ulcer.    LABORATORY PANEL:   CBC  Recent Labs Lab 05/05/16 0339  WBC 13.7*  HGB 10.0*  HCT 29.8*  PLT 202   ------------------------------------------------------------------------------------------------------------------  Chemistries   Recent Labs Lab 05/04/16 0947 05/05/16 0339  NA 136 136  K 3.9 3.7  CL 103 106  CO2 26 23  GLUCOSE 105* 105*  BUN 30* 25*  CREATININE 1.08* 0.84  CALCIUM 9.3 8.7*  AST 23  --   ALT 10*  --   ALKPHOS 60  --   BILITOT 0.9  --    ------------------------------------------------------------------------------------------------------------------  Cardiac Enzymes  Recent Labs Lab 05/04/16 0947  TROPONINI <0.03   ------------------------------------------------------------------------------------------------------------------  RADIOLOGY:  No results found.  EKG:   Orders placed or performed during the hospital encounter of 05/04/16  . ED EKG  . ED EKG    ASSESSMENT AND PLAN:    82 year old female who presents with right shoulder pain and inability to walk with nystagmus and right lower extremity weakness on examination.  1. Dizziness and lightheaded: NEAR SYNCOPE -secondary to vasovagal/UTI Reports she was dizzy from right shoulder pain while husband was maneuvering to bring her out of bed  Patient MRI negative Physical therapy has recommended home health PT and RN  Provide IV fluids  Patient is clinically feeling better ?? normal pressure hydrocephalus per MRI -outpatient neurology follow-up is recommended  2. UTI:  Patient is on Rocephin and will need to have follow  urine culture with >100,000 colonies gm  neg rods, sensitivity will be back tomorrow  3. Right  shoulder pain: Right shoulder x-ray here shows severe arthritis which is likely the etiology of her pain. Right shoulder sling PT has required home health PT Pain management as needed  4. Essential hypertension: Blood pressure is elevated  Patient continue outpatient medications which include propanolol and losartan, increase losartan dose to 50 mg    All the records are reviewed and case discussed with Care Management/Social Workerr. Management plans discussed with the patient, family and they are in agreement.  CODE STATUS:  FC, HUSBAND HCPOA  TOTAL TIME TAKING CARE OF THIS PATIENT: 33 minutes.   POSSIBLE D/C IN 1 DAYS, DEPENDING ON CLINICAL CONDITION.  Note: This dictation was prepared with Dragon dictation along with smaller phrase technology. Any transcriptional errors that result from this process are unintentional.   Nicholes Mango M.D on 05/06/2016 at 9:00 PM  Between 7am to 6pm - Pager - [phone] After 6pm go to [email or web address] - password [email or web address] [place] [facility]  Office  [phone]  CC: Primary care physician; [place]

## 2016-05-07 LAB — URINE CULTURE: Culture: >=100000 — AB

## 2016-05-07 MED ORDER — CEPHALEXIN 500 MG PO CAPS: 500.0000 mg | ORAL_CAPSULE | Freq: Two times a day (BID) | ORAL | 0 refills | Status: AC

## 2016-05-07 MED ORDER — OXYCODONE HCL 5 MG PO TABS: 5.0000 mg | ORAL_TABLET | ORAL | 0 refills | Status: AC | PRN

## 2016-05-07 NOTE — NC FL2 (Signed)
  [place] LEVEL OF CARE SCREENING TOOL     IDENTIFICATION  Patient Name: Cathy Juarez Birthdate: [birth year] Sex: female Admission Date (Current Location): 05/04/2016  [facility] and [place] Number:  Engineering geologist and Address:  [facility], [address], [place], [place] [address]      Provider Number: [id number]  Attending Physician Name and Address:  Lytle Butte, MD  Relative Name and Phone Number:       Current Level of Care: Hospital Recommended Level of Care: [place] Prior Approval Number:    Date Approved/Denied:   PASRR Number:    Discharge Plan: Domiciliary (Rest home) ([place] )    Current Diagnoses: Patient Active Problem List   Diagnosis Date Noted  . UTI (urinary tract infection) 05/04/2016  Hypertension  Orientation RESPIRATION BLADDER Height & Weight     Self, Time, Situation, Place  Normal Incontinent Weight: 142 lb 6.7 oz (64.6 kg) Height:  5\' 4"  (162.6 cm)  BEHAVIORAL SYMPTOMS/MOOD NEUROLOGICAL BOWEL NUTRITION STATUS   (None.)  (None.) Continent Heart Healthy Diet   AMBULATORY STATUS COMMUNICATION OF NEEDS Skin   Limited Assist Verbally Normal                       Personal Care Assistance Level of Assistance  Bathing, Feeding, Dressing Bathing Assistance: Limited assistance Feeding assistance: Limited assistance Dressing Assistance: Limited assistance     Functional Limitations Info  Sight, Hearing, Speech Sight Info: Adequate Hearing Info: Impaired Speech Info: Adequate    SPECIAL CARE FACTORS FREQUENCY  PT (By licensed PT), OT (By licensed OT)  Home Health PT and OT      PT Frequency:  (2-3) OT Frequency:  (2-3)            Contractures      Additional Factors Info  Code Status, Allergies Code Status Info:  (Full Code) Allergies Info:  (No Known Allergies)          Discharge Medications: Please see discharge summary for a list  of discharge medications. Current Discharge Medication List       START taking these medications   Details  cephALEXin (KEFLEX) 500 MG capsule Take 1 capsule (500 mg total) by mouth 2 (two) times daily. Qty: 14 capsule, Refills: 0    oxyCODONE (OXY IR/ROXICODONE) 5 MG immediate release tablet Take 1 tablet (5 mg total) by mouth every 4 (four) hours as needed for severe pain. Qty: 30 tablet, Refills: 0         CONTINUE these medications which have NOT CHANGED   Details  aspirin EC 81 MG tablet Take 81 mg by mouth daily.    azithromycin (ZITHROMAX) 250 MG tablet Take 500 mg by mouth 2 (two) times a week.     ipratropium-albuterol (DUONEB) 0.5-2.5 (3) MG/3ML SOLN Take 3 mLs by nebulization every 6 (six) hours as needed.    loratadine (CLARITIN) 10 MG tablet Take 10 mg by mouth daily.    losartan (COZAAR) 50 MG tablet Take 0.5 tablets by mouth daily.     propranolol (INDERAL) 20 MG tablet Take 20 mg by mouth 2 (two) times daily.      Relevant Imaging Results: Relevant Lab Results: Additional Information  (SSN: [id number])  [employer], Veronia Beets, LCSW

## 2016-05-07 NOTE — Care Management Important Message (Signed)
Important Message  Patient Details  Name: Cathy Juarez MRN: [id number] Date of Birth: 1927/03/11   Medicare Important Message Given:  Yes    Jolly Mango, RN 05/07/2016, 11:26 AM

## 2016-05-07 NOTE — Discharge Summary (Signed)
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 Hypertension     HOSPITAL COURSE:  Cathy Juarez  is a 82 y.o. female admitted 05/04/2016 with chief complaint Arm Pain and Dizziness . Please see H&P performed by Bettey Costa, MD for further information. Patient presented with the above symptoms - near syncope secondary to pain from right shoulder. 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 Qty: 14 capsule, Refills: 0    oxyCODONE (OXY IR/ROXICODONE) 5 MG immediate release tablet Take 1 tablet (5 mg total) by mouth every 4 (four) hours as needed for severe pain. Qty: 30 tablet, Refills: 0      CONTINUE these medications which have NOT CHANGED   Details  aspirin EC 81 MG tablet Take 81 mg by mouth daily.    azithromycin (ZITHROMAX) 250 MG tablet Take 500 mg by mouth 2 (two) times a week.     ipratropium-albuterol (DUONEB) 0.5-2.5 (3) MG/3ML SOLN Take 3 mLs by  nebulization every 6 (six) hours as needed.    loratadine (CLARITIN) 10 MG tablet Take 10 mg by mouth daily.    losartan (COZAAR) 50 MG tablet Take 0.5 tablets by mouth daily.     propranolol (INDERAL) 20 MG tablet Take 20 mg by mouth 2 (two) times daily.         DISCHARGE INSTRUCTIONS:    DIET:  Cardiac diet  DISCHARGE CONDITION:  Stable  ACTIVITY:  Activity as tolerated  OXYGEN:  Home Oxygen: No.   Oxygen Delivery: room air  DISCHARGE LOCATION:  ALF   If you experience worsening of your admission symptoms, develop shortness of breath, life threatening emergency, suicidal or homicidal thoughts you must seek medical attention immediately by calling 911 or calling your MD immediately  if symptoms less severe.  You Must read complete instructions/literature along with all the possible adverse reactions/side effects for all the Medicines you take and that have been prescribed to you. Take any new Medicines after you have completely understood and accpet all the possible adverse reactions/side effects.   Please note  You were cared for by a hospitalist during your hospital stay. If you have any questions about your discharge medications or the care you received while you were in the hospital after you are discharged, you can call the unit and asked to speak with the hospitalist on call if the hospitalist that took care of you is not available. Once you are discharged, your primary care physician will handle any further medical issues.  Please note that NO REFILLS for any discharge medications will be authorized once you are discharged, as it is imperative that you return to your primary care physician (or establish a relationship with a primary care physician if you do not have one) for your aftercare needs so that they can reassess your need for medications and monitor your lab values.    On the day of Discharge:   VITAL SIGNS:  Blood pressure (!) 162/61, pulse 75,  temperature 98.3 F (36.8 C), temperature source Oral, resp. rate 18, height 5\' 4"  (1.626 m), weight 64.6 kg (142 lb 6.7 oz), SpO2 99 %.  I/O:   Intake/Output Summary (Last 24 hours) at 05/07/16 0929 Last data filed at 05/07/16 0400  Gross per 24 hour  Intake              240 ml  Output                0 ml  Net              240 ml    PHYSICAL EXAMINATION:  GENERAL:  82 y.o.-year-old patient lying in the bed with no acute distress.  EYES: Pupils equal, round, reactive to light and accommodation. No scleral icterus. Extraocular muscles intact.  HEENT: Head atraumatic, normocephalic. Oropharynx and nasopharynx clear.  NECK:  Supple, no jugular venous distention. No thyroid enlargement, no tenderness.  LUNGS: Normal breath sounds bilaterally, no wheezing, rales,rhonchi or crepitation. No use of accessory muscles of respiration.  CARDIOVASCULAR: S1, S2 normal. No murmurs, rubs, or gallops.  ABDOMEN: Soft, non-tender, non-distended. Bowel sounds present. No organomegaly or mass.  EXTREMITIES: No pedal edema, cyanosis, or clubbing.  NEUROLOGIC: Cranial nerves II through XII are intact. Muscle strength 5/5 in all extremities. Sensation intact. Gait not checked.  PSYCHIATRIC: The patient is alert and oriented x 3.  SKIN: No obvious rash, lesion, or ulcer.   DATA REVIEW:   CBC  Recent Labs Lab 05/05/16 0339  WBC 13.7*  HGB 10.0*  HCT 29.8*  PLT 202    Chemistries   Recent Labs Lab 05/04/16 0947 05/05/16 0339  NA 136 136  K 3.9 3.7  CL 103 106  CO2 26 23  GLUCOSE 105* 105*  BUN 30* 25*  CREATININE 1.08* 0.84  CALCIUM 9.3 8.7*  AST 23  --   ALT 10*  --   ALKPHOS 60  --   BILITOT 0.9  --     Cardiac Enzymes  Recent Labs Lab 05/04/16 0947  TROPONINI <0.03    Microbiology Results  Results for orders placed or performed during the hospital encounter of 05/04/16  Urine culture     Status: Abnormal   Collection Time: 05/04/16 10:33 AM  Result Value Ref Range  Status   Specimen Description URINE, RANDOM  Final   Special Requests NONE  Final   Culture >=100,000 COLONIES/mL ESCHERICHIA COLI (A)  Final   Report Status 05/07/2016 FINAL  Final   Organism ID, Bacteria ESCHERICHIA COLI (A)  Final      Susceptibility   Escherichia coli - MIC*    AMPICILLIN 8 SENSITIVE Sensitive     CEFAZOLIN <=4 SENSITIVE Sensitive     CEFTRIAXONE <=1 SENSITIVE Sensitive     CIPROFLOXACIN >=4 RESISTANT Resistant     GENTAMICIN <=1 SENSITIVE Sensitive     IMIPENEM <=0.25 SENSITIVE Sensitive     NITROFURANTOIN <=16 SENSITIVE Sensitive     TRIMETH/SULFA >=320 RESISTANT Resistant     AMPICILLIN/SULBACTAM <=2 SENSITIVE  Sensitive     PIP/TAZO <=4 SENSITIVE Sensitive     Extended ESBL NEGATIVE Sensitive     * >=100,000 COLONIES/mL ESCHERICHIA COLI    RADIOLOGY:  No results found.   Management plans discussed with the patient, family and they are in agreement.  CODE STATUS:     Code Status Orders        Start     Ordered   05/04/16 1319  Full code  Continuous     05/04/16 1318    Code Status History    Date Active Date Inactive Code Status Order ID Comments User Context   This patient has a current code status but no historical code status.    Advance Directive Documentation   Flowsheet Row Most Recent Value  Type of Advance Directive  Healthcare Power of Attorney  Pre-existing out of facility DNR order (yellow form or pink MOST form)  No data  "MOST" Form in Place?  No data      TOTAL TIME TAKING CARE OF THIS PATIENT: 33 minutes.    Hower,  Karenann Cai.D on 05/07/2016 at 9:29 AM  Between 7am to 6pm - Pager - [phone]  After 6pm go to [email or web address] - Technical brewer [facility]  Office  [phone]  CC: Primary care physician; [place]

## 2016-05-07 NOTE — Progress Notes (Signed)
Patient is medically stable for D/C to [facility] for respite care today. Clinical Education officer, museum (CSW) sent D/C orders to [facility] today. Patient's daughter in law [name] is at [facility] today completing paper work. Per [name] she will transport patient in her personal vehicle. RN will call report to [facility]. Patient is aware of above. Please reconsult if future social work needs arise. CSW signing off.   [employer], LCSW [phone]

## 2016-05-07 NOTE — Care Management Note (Signed)
Case Management Note  Patient Details  Name: Cathy Juarez MRN: [id number] Date of Birth: [birth year]  Subjective/Objective:   Patient discharging to [facility]. Home health set up with [facility].                 Action/Plan: [facility] notified of discharge today.   Expected Discharge Date:                  Expected Discharge Plan:  [place]  In-House Referral:     Discharge planning Services  CM Consult  Post Acute Care Choice:  Home Health Choice offered to:  Adult Children  DME Arranged:    DME Agency:     HH Arranged:  RN, PT [place] Agency:  [place] (now [facility])  Status of Service:  Completed, signed off  If discussed at [place] of Stay Meetings, dates discussed:    Additional Comments:  Jolly Mango, RN 05/07/2016, 9:30 AM

## 2017-03-03 ENCOUNTER — Ambulatory Visit
Admission: EM | Admit: 2017-03-03 | Discharge: 2017-03-03 | Disposition: A | Discharge status: Home / Self Care | Payer: Medicare Other | Attending: Family Medicine | Admitting: Family Medicine

## 2017-03-03 DIAGNOSIS — I10 Essential (primary) hypertension: Secondary | ICD-10-CM | POA: Insufficient documentation

## 2017-03-03 DIAGNOSIS — R300 Dysuria: Secondary | ICD-10-CM

## 2017-03-03 DIAGNOSIS — Z87440 Personal history of urinary (tract) infections: Secondary | ICD-10-CM | POA: Insufficient documentation

## 2017-03-03 DIAGNOSIS — Z7982 Long term (current) use of aspirin: Secondary | ICD-10-CM | POA: Insufficient documentation

## 2017-03-03 LAB — URINALYSIS, COMPLETE (UACMP) WITH MICROSCOPIC
Bilirubin Urine: NEGATIVE
GLUCOSE, UA: NEGATIVE mg/dL
HGB URINE DIPSTICK: NEGATIVE
Ketones, ur: NEGATIVE mg/dL
NITRITE: NEGATIVE
PH: 7 (ref 5.0–8.0)
Protein, ur: 30 mg/dL — AB
RBC / HPF: NONE SEEN RBC/hpf (ref 0–5)
SPECIFIC GRAVITY, URINE: 1.015 (ref 1.005–1.030)

## 2017-03-03 MED ORDER — CEPHALEXIN 500 MG PO CAPS: 500.0000 mg | ORAL_CAPSULE | Freq: Two times a day (BID) | ORAL | 0 refills | Status: AC

## 2017-03-03 NOTE — Discharge Instructions (Signed)
Take medication as prescribed. Rest. Drink plenty of fluids.   Monitor blood pressure at home closely and follow up with your doctor this week.   Follow up with your primary care physician this week as discussed. Return to Urgent care for new or worsening concerns.

## 2017-03-03 NOTE — ED Provider Notes (Signed)
[facility] ____________________________________________  Time seen: Approximately 11:01 AM  I have reviewed the triage vital signs and the nursing notes.   HISTORY  Chief Complaint Urinary Tract Infection  HPI Cathy Juarez is a 83 y.o. female  Presenting with husband at bedside for evaluation of urinary frequency, urinary urgency and some suprapubic pressure. States has noticed that the last few days, increased today with associated burning with urination. Denies other abdominal pain. States no change in her normal low back pain. Denies fevers, vomiting, diarrhea or other complaints. Reports continues to eat and drink as normal. Reports continues with normal levels of activity. Reports lives in [facility]. Patient states that she has had urinary tract infections in the past with similar presentation. Denies any recent urinary tract infection, stating is been greater than one year. Denies any hematuria or vaginal complaints. Reports otherwise feels well.  Denies chest pain, shortness of breath, or rash. Denies recent sickness. Denies recent antibiotic use. Denies renal insufficiency.  PCP: At [facility]   Past Medical History:  Diagnosis Date  . Hypertension     Patient Active Problem List   Diagnosis Date Noted  . UTI (urinary tract infection) 05/04/2016    Past Surgical History:  Procedure Laterality Date  . ABDOMINAL HYSTERECTOMY       No current facility-administered medications for this encounter.   Current Outpatient Prescriptions:  .  ipratropium-albuterol (DUONEB) 0.5-2.5 (3) MG/3ML SOLN, Take 3 mLs by nebulization every 6 (six) hours as needed., Disp: , Rfl:  .  aspirin EC 81 MG tablet, Take 81 mg by mouth daily., Disp: , Rfl:  .  azithromycin (ZITHROMAX) 250 MG tablet, Take 500 mg by mouth 2 (two) times a week. , Disp: , Rfl:  .  cephALEXin (KEFLEX) 500 MG capsule, Take 1 capsule (500 mg total) by mouth 2 (two) times daily.,  Disp: 14 capsule, Rfl: 0 .  loratadine (CLARITIN) 10 MG tablet, Take 10 mg by mouth daily., Disp: , Rfl:  .  losartan (COZAAR) 50 MG tablet, Take 0.5 tablets by mouth daily. , Disp: , Rfl:  .  oxyCODONE (OXY IR/ROXICODONE) 5 MG immediate release tablet, Take 1 tablet (5 mg total) by mouth every 4 (four) hours as needed for severe pain., Disp: 30 tablet, Rfl: 0 .  propranolol (INDERAL) 20 MG tablet, Take 20 mg by mouth 2 (two) times daily., Disp: , Rfl:   Allergies Patient has no known allergies.  No family history on file.  Social History Social History  Substance Use Topics  . Smoking status: Never Smoker  . Smokeless tobacco: Never Used  . Alcohol use No    Review of Systems Constitutional: No fever/chills Cardiovascular: Denies chest pain. Respiratory: Denies shortness of breath. Gastrointestinal: AS above. No nausea, no vomiting.  No diarrhea.  No constipation. Genitourinary: Positive for dysuria. Musculoskeletal: Negative for back pain. Skin: Negative for rash.  ____________________________________________   PHYSICAL EXAM:  VITAL SIGNS: ED Triage Vitals  Enc Vitals Group     BP 03/03/17 0956 (!) 99/38     Pulse Rate 03/03/17 0956 82     Resp 03/03/17 0956 12     Temp 03/03/17 0956 97.6 F (36.4 C)     Temp Source 03/03/17 0956 Oral     SpO2 03/03/17 0956 97 %     Weight 03/03/17 0944 140 lb (63.5 kg)     Height 03/03/17 0944 5\' 5"  (1.651 m)     Head Circumference --  Peak Flow --      Pain Score 03/03/17 0944 0     Pain Loc --      Pain Edu? --      Excl. in [place]? --    Vitals:   03/03/17 0944 03/03/17 0956 03/03/17 1101  BP:  (!) 99/38 (!) 124/50  Pulse:  82 70  Resp:  12   Temp:  97.6 F (36.4 C) (!) 97.5 F (36.4 C)  TempSrc:  Oral Oral  SpO2:  97% 100%  Weight: 140 lb (63.5 kg)    Height: 5\' 5"  (1.651 m)      Constitutional: Alert and oriented. Well appearing and in no acute distress.. Cardiovascular: Normal rate, regular rhythm. Grossly  normal heart sounds.  Good peripheral circulation. Respiratory: Normal respiratory effort without tachypnea nor retractions. Breath sounds are clear and equal bilaterally. No wheezes, rales, rhonchi. Gastrointestinal: Soft and nontender. No distention. Normal Bowel sounds. No CVA tenderness. Musculoskeletal: Steady gait, ambulatory with walker. No midline cervical, thoracic or lumbar tenderness to palpation. Neurologic:  Normal speech and language.  Speech is normal. No gait instability.  Skin:  Skin is warm, dry. Psychiatric: Mood and affect are normal. Speech and behavior are normal. Patient exhibits appropriate insight and judgment   ___________________________________________   LABS (all labs ordered are listed, but only abnormal results are displayed)  Labs Reviewed  URINALYSIS, COMPLETE (UACMP) WITH MICROSCOPIC - Abnormal; Notable for the following:       Result Value   APPearance HAZY (*)    Protein, ur 30 (*)    Leukocytes, UA SMALL (*)    Squamous Epithelial / LPF 0-5 (*)    Bacteria, UA FEW (*)    All other components within normal limits  URINE CULTURE     PROCEDURES Procedures     INITIAL IMPRESSION / ASSESSMENT AND PLAN / ED COURSE  Pertinent labs & imaging results that were available during my care of the patient were reviewed by me and considered in my medical decision making (see chart for details).  Well-appearing patient. Husband at bedside. Complaining of dysuria for the last few days. Urinalysis reviewed, will culture urine. Will start oral cephalexin. Encouraged monitoring closely, also discussed monitoring blood pressure and follow up with PCP this week. Discussed sooner return parameters including fevers, pain, inability to tolerate medication or worsening concerns. Discussed indication, risks and benefits of medications with patient and spouse.  Discussed follow up with Primary care physician this week. Discussed follow up and return parameters including  no resolution or any worsening concerns. Patient verbalized understanding and agreed to plan.   ____________________________________________   FINAL CLINICAL IMPRESSION(S) / ED DIAGNOSES  Final diagnoses:  Dysuria     Discharge Medication List as of 03/03/2017 11:25 AM    START taking these medications   Details  cephALEXin (KEFLEX) 500 MG capsule Take 1 capsule (500 mg total) by mouth 2 (two) times daily., Starting Tue 03/03/2017, Until Tue 03/10/2017, Normal        Note: This dictation was prepared with Dragon dictation along with smaller phrase technology. Any transcriptional errors that result from this process are unintentional.         Marylene Land, NP 03/03/17 1144

## 2017-03-03 NOTE — ED Triage Notes (Signed)
Pt has been having problems with feeling she needs to urinate frequently for the past week or so. Unable to obtain medication list from pt.

## 2017-03-05 LAB — URINE CULTURE

## 2018-03-16 DEATH — deceased

## 2018-05-12 IMAGING — CR DG SACRUM/COCCYX 2+V
3 series · 3 of 3 positions shown · non-contrast
Comparison: None.

CLINICAL DATA: Acute tailbone and right lower extremity pain. No
known injury.

EXAM:
SACRUM AND COCCYX - 2+ VIEW

[coccyx ap]
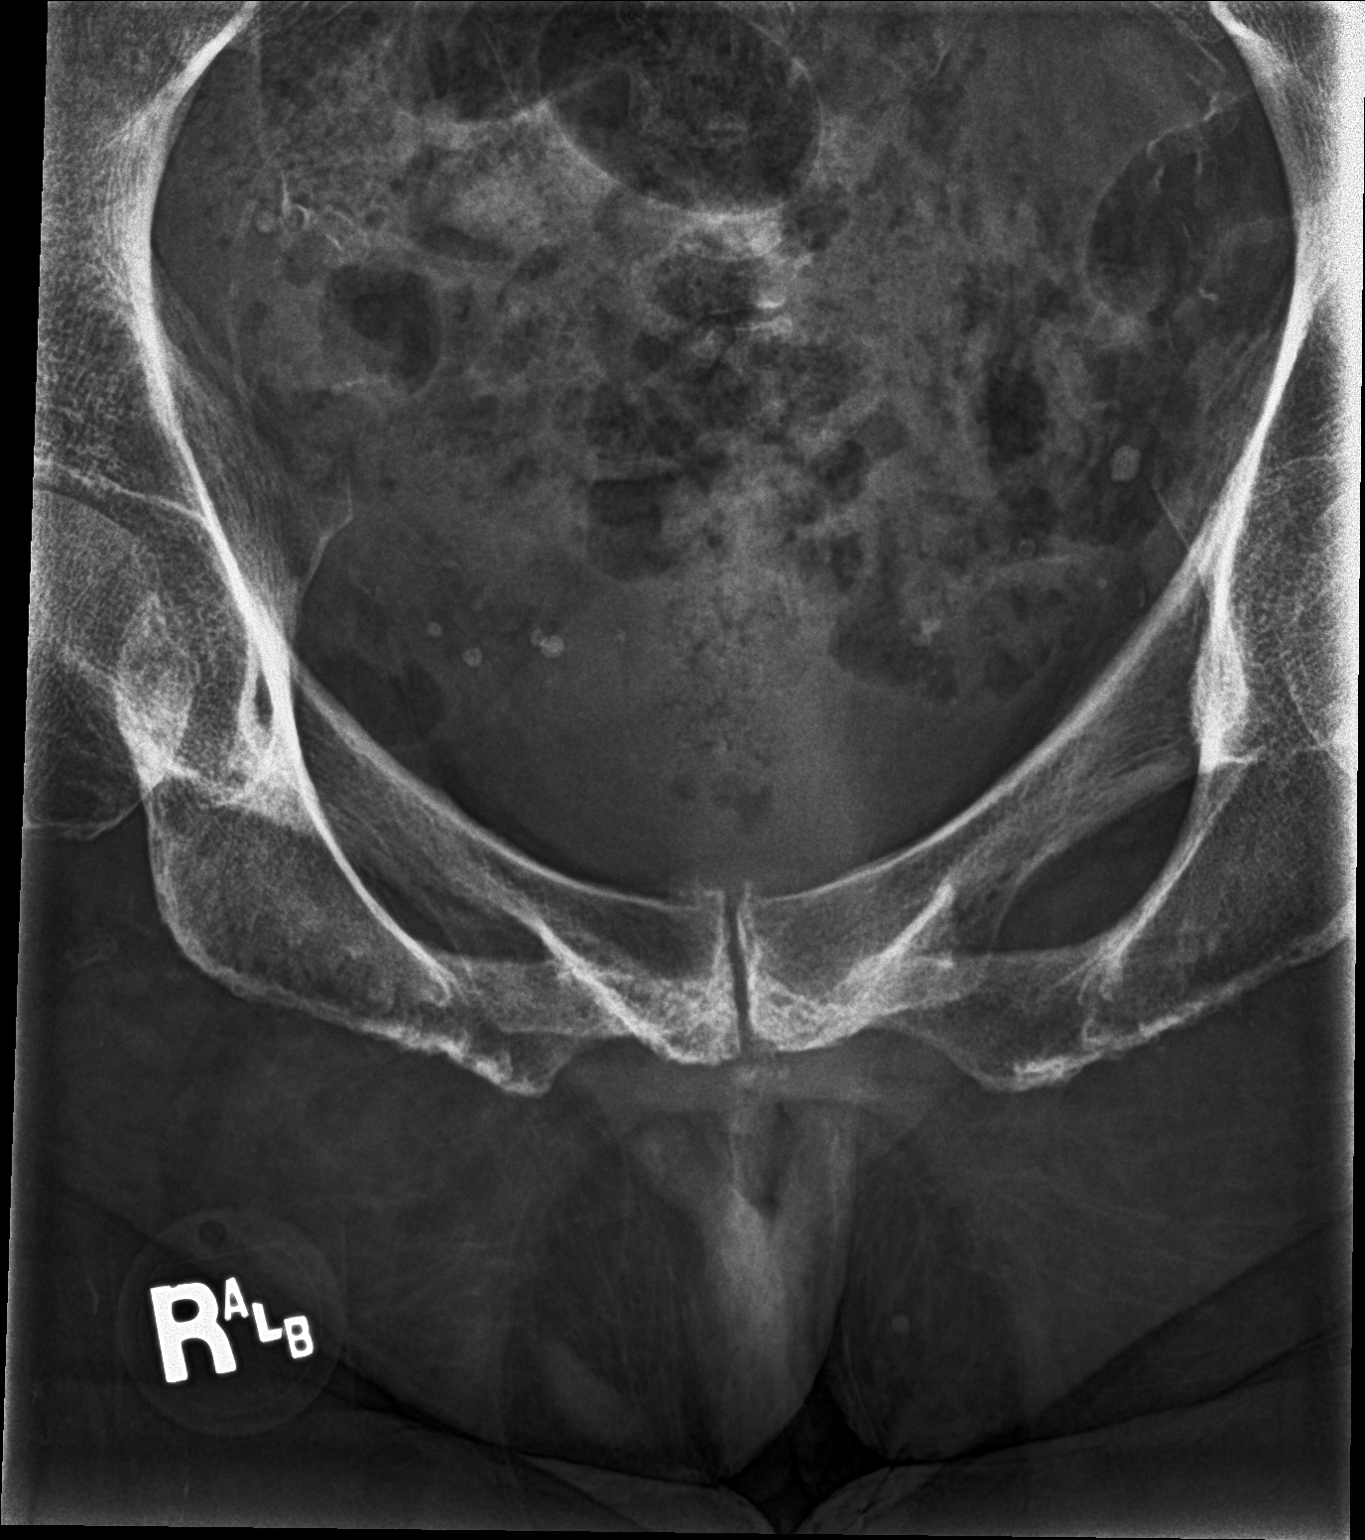

[sacrum ap]
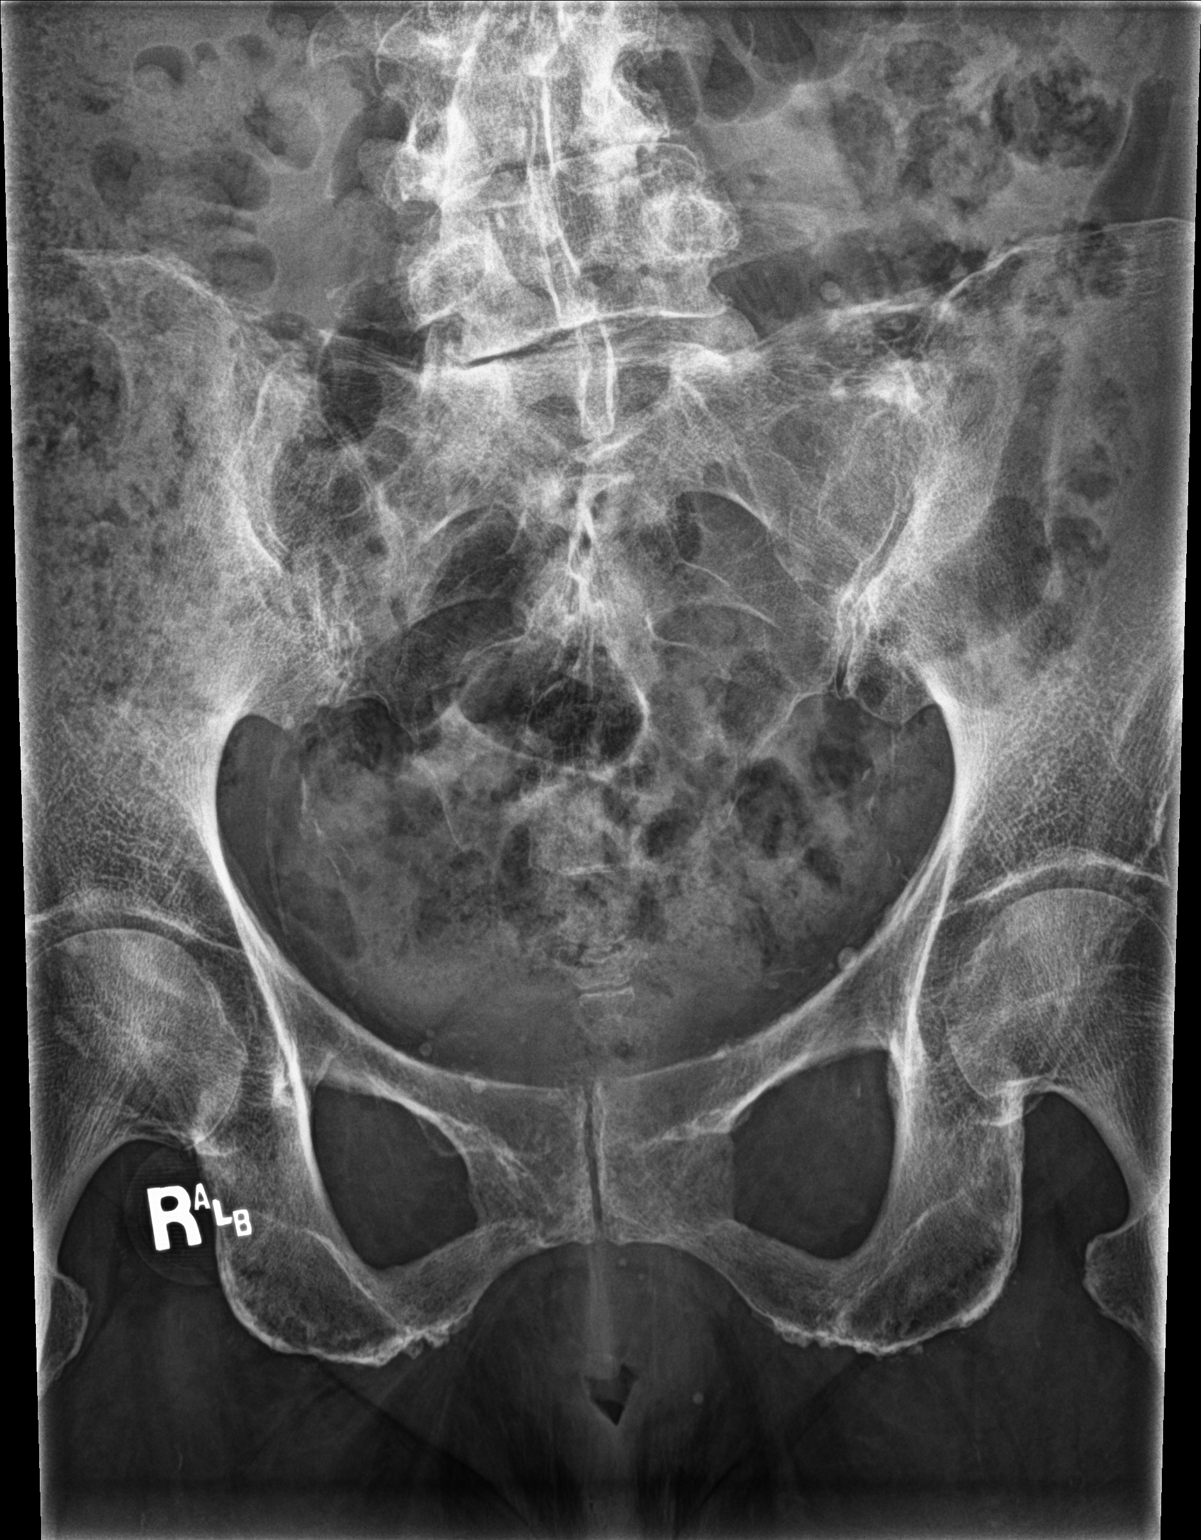

[sacrum lat]
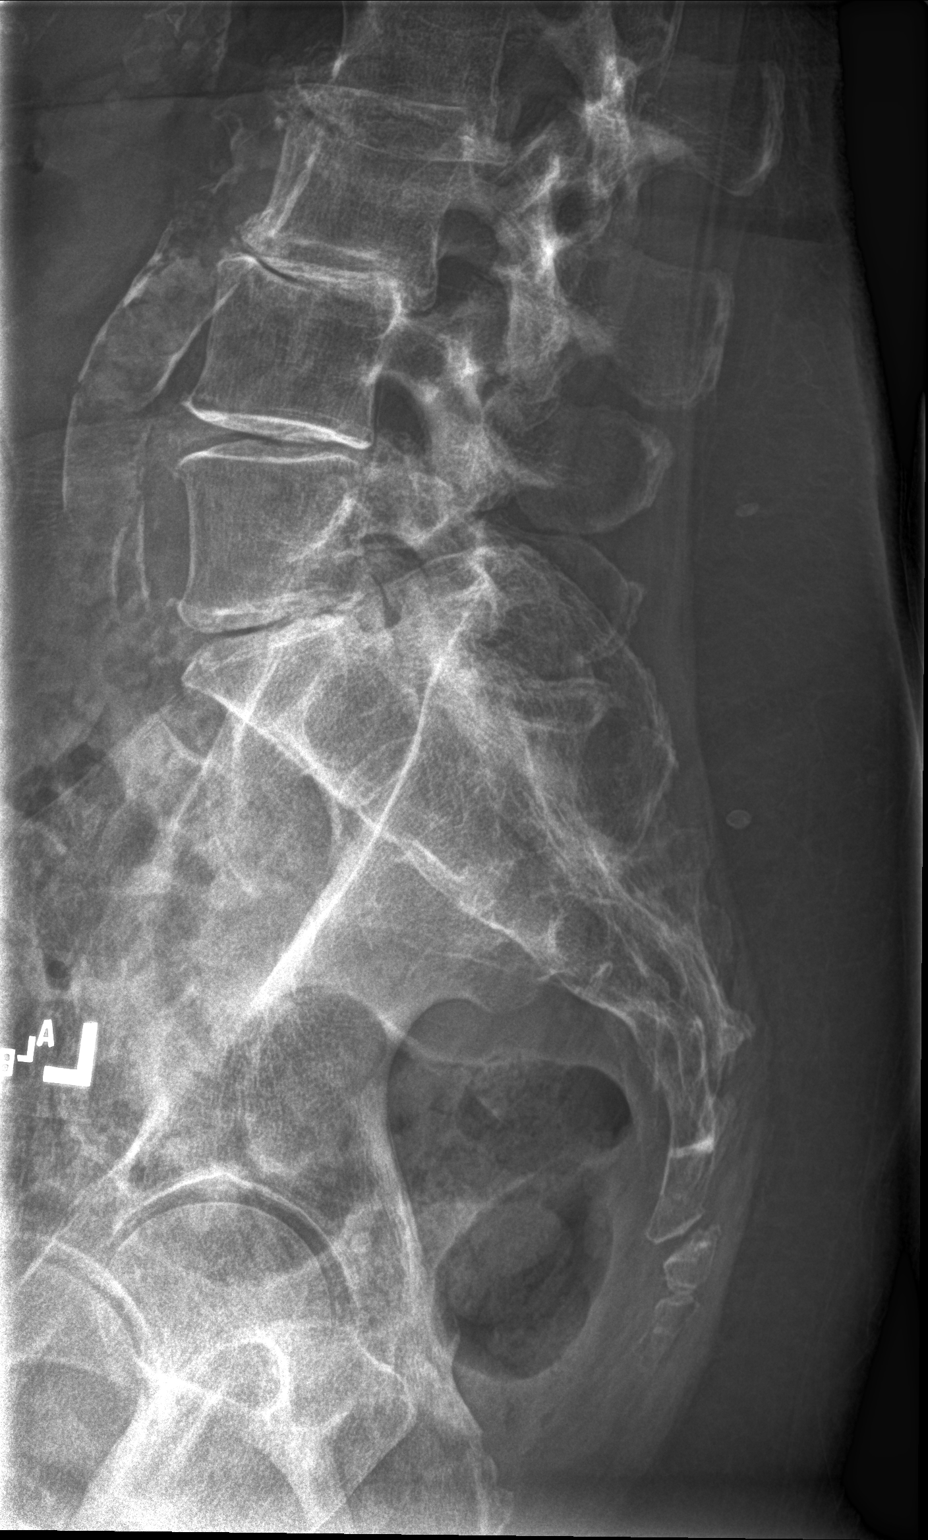

[3 of 3 positions shown; findings below may reference images not displayed]

FINDINGS: There is no evidence of fracture or other focal bone lesions.
IMPRESSION: No acute abnormality seen in the sacrum or coccyx.

## 5552-10-14 DEATH — deceased
